# Patient Record
Sex: Female | Born: 1995 | Race: Black or African American | Hispanic: No | Marital: Single | Smoking: Never smoker
Health system: Southern US, Community
[De-identification: ages and names within clinical notes are randomized; demographics above are authoritative.]

## PROBLEM LIST (undated history)

## (undated) DIAGNOSIS — F32A Depression, unspecified: Secondary | ICD-10-CM

## (undated) DIAGNOSIS — F419 Anxiety disorder, unspecified: Secondary | ICD-10-CM

## (undated) HISTORY — PX: TONSILLECTOMY: SUR1361

## (undated) HISTORY — DX: Anxiety disorder, unspecified: F41.9

## (undated) HISTORY — DX: Depression, unspecified: F32.A

---
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
## 2016-01-09 ENCOUNTER — Emergency Department (INDEPENDENT_AMBULATORY_CARE_PROVIDER_SITE_OTHER)
Admission: EM | Admit: 2016-01-09 | Discharge: 2016-01-09 | Disposition: A | Discharge status: Home / Self Care | Payer: Medicaid Other | Source: Home / Self Care | Attending: Family Medicine | Admitting: Family Medicine

## 2016-01-09 ENCOUNTER — Encounter (HOSPITAL_COMMUNITY): Payer: Self-pay | Admitting: *Deleted

## 2016-01-09 DIAGNOSIS — J069 Acute upper respiratory infection, unspecified: Secondary | ICD-10-CM

## 2016-01-09 MED ORDER — IPRATROPIUM BROMIDE 0.06 % NA SOLN: 2.0000 | Freq: Four times a day (QID) | NASAL | Status: DC

## 2016-01-09 MED ORDER — NEOMYCIN-POLYMYXIN-HC 3.5-10000-1 OT SUSP: 4.0000 [drp] | Freq: Three times a day (TID) | OTIC | Status: DC

## 2016-01-09 NOTE — ED Provider Notes (Signed)
CSN: [id number]     Arrival date & time 01/09/16  1314 History   First MD Initiated Contact with Patient 01/09/16 1435     Chief Complaint  Patient presents with  . Otalgia   (Consider location/radiation/quality/duration/timing/severity/associated sxs/prior Treatment) Patient is a 20 y.o. female presenting with ear pain. The history is provided by the patient.  Otalgia Location:  Right Behind ear:  No abnormality Quality:  Pressure and dull Severity:  Mild Onset quality:  Gradual Duration:  2 weeks Progression:  Unchanged Chronicity:  Recurrent Relieved by:  None tried Worsened by:  Nothing tried Ineffective treatments:  None tried Associated symptoms: congestion and rhinorrhea   Associated symptoms: no cough, no ear discharge and no fever     History reviewed. No pertinent past medical history. Past Surgical History  Procedure Laterality Date  . Tonsillectomy     History reviewed. No pertinent family history. Social History  Substance Use Topics  . Smoking status: Never Smoker   . Smokeless tobacco: None  . Alcohol Use: Yes     Comment: socially   OB History    No data available     Review of Systems  Constitutional: Negative.  Negative for fever.  HENT: Positive for congestion, ear pain, postnasal drip and rhinorrhea. Negative for ear discharge.   Respiratory: Negative.  Negative for cough.   Cardiovascular: Negative.   All other systems reviewed and are negative.   Allergies  Review of patient's allergies indicates no known allergies.  Home Medications   Prior to Admission medications   Medication Sig Start Date End Date Taking? Authorizing Provider  amoxicillin-clavulanate (AUGMENTIN) 875-125 MG tablet Take 1 tablet by mouth 2 (two) times daily. One po bid x 7 days 10/21/15   Arthor Captain, PA-C  ipratropium (ATROVENT) 0.06 % nasal spray Place 2 sprays into both nostrils 4 (four) times daily. 01/09/16   Linna Hoff, MD  levonorgestrel-ethinyl estradiol  (NORDETTE) 0.15-30 MG-MCG tablet Take 1 tablet by mouth daily. 05/04/13   Brock Bad, MD  medroxyPROGESTERone (DEPO-PROVERA) 150 MG/ML injection Inject 150 mg into the muscle every 3 (three) months.    Historical Provider, MD  naproxen sodium (ANAPROX) 220 MG tablet Take 220 mg by mouth 2 (two) times daily with a meal.    Historical Provider, MD  neomycin-polymyxin-hydrocortisone (CORTISPORIN) 3.5-10000-1 otic suspension Place 4 drops into the right ear 3 (three) times daily. 01/09/16   Linna Hoff, MD  norelgestromin-ethinyl estradiol [facility]) 150-35 MCG/24HR transdermal patch Place 1 patch onto the skin once a week. 07/27/15   Brock Bad, MD  traMADol (ULTRAM) 50 MG tablet Take 1 tablet (50 mg total) by mouth every 6 (six) hours as needed. 10/21/15   Arthor Captain, PA-C   Meds Ordered and Administered this Visit  Medications - No data to display  BP 112/73 mmHg  Pulse 111  Temp(Src) 100.4 F (38 C) (Oral)  Resp 18  SpO2 97% No data found.   Physical Exam  Constitutional: She is oriented to person, place, and time. She appears well-developed and well-nourished.  HENT:  Right Ear: External ear normal.  Left Ear: External ear normal.  Nose: Mucosal edema and rhinorrhea present.  Mouth/Throat: Oropharynx is clear and moist.  Eyes: Pupils are equal, round, and reactive to light.  Neck: Normal range of motion. Neck supple.  Cardiovascular: Normal heart sounds.   Pulmonary/Chest: Effort normal and breath sounds normal.  Lymphadenopathy:    She has no cervical adenopathy.  Neurological: She is  alert and oriented to person, place, and time.  Skin: Skin is warm and dry.  Nursing note and vitals reviewed.   ED Course  Procedures (including critical care time)  Labs Review Labs Reviewed - No data to display  Imaging Review No results found.   Visual Acuity Review  Right Eye Distance:   Left Eye Distance:   Bilateral Distance:    Right Eye Near:   Left Eye Near:     Bilateral Near:         MDM   1. URI (upper respiratory infection)        Linna Hoff, MD 01/09/16 [phone]

## 2016-01-09 NOTE — ED Notes (Signed)
Pt  Reports   Both   Ears    Are  Hurting       Symptoms  X     sev  Weeks     Pt  Reports       Symptoms  Not  releived  By  otc  meds            pt  Reports  A  History  Of  Ear  Infections   In  Past

## 2016-02-27 ENCOUNTER — Ambulatory Visit (HOSPITAL_COMMUNITY)
Admission: EM | Admit: 2016-02-27 | Discharge: 2016-02-27 | Disposition: A | Discharge status: Home / Self Care | Payer: Medicaid Other | Attending: Emergency Medicine | Admitting: Emergency Medicine

## 2016-02-27 ENCOUNTER — Encounter (HOSPITAL_COMMUNITY): Payer: Self-pay | Admitting: Emergency Medicine

## 2016-02-27 DIAGNOSIS — N939 Abnormal uterine and vaginal bleeding, unspecified: Secondary | ICD-10-CM

## 2016-02-27 DIAGNOSIS — R252 Cramp and spasm: Secondary | ICD-10-CM | POA: Diagnosis not present

## 2016-02-27 DIAGNOSIS — Z9889 Other specified postprocedural states: Secondary | ICD-10-CM | POA: Diagnosis not present

## 2016-02-27 DIAGNOSIS — R8290 Unspecified abnormal findings in urine: Secondary | ICD-10-CM

## 2016-02-27 DIAGNOSIS — Z79899 Other long term (current) drug therapy: Secondary | ICD-10-CM | POA: Insufficient documentation

## 2016-02-27 DIAGNOSIS — N946 Dysmenorrhea, unspecified: Secondary | ICD-10-CM | POA: Diagnosis present

## 2016-02-27 DIAGNOSIS — N949 Unspecified condition associated with female genital organs and menstrual cycle: Secondary | ICD-10-CM | POA: Diagnosis not present

## 2016-02-27 DIAGNOSIS — R102 Pelvic and perineal pain: Secondary | ICD-10-CM

## 2016-02-27 LAB — POCT URINALYSIS DIP (DEVICE)
BILIRUBIN URINE: NEGATIVE
Glucose, UA: NEGATIVE mg/dL
KETONES UR: NEGATIVE mg/dL
Nitrite: POSITIVE — AB
Protein, ur: 30 mg/dL — AB
SPECIFIC GRAVITY, URINE: 1.015 (ref 1.005–1.030)
Urobilinogen, UA: 1 mg/dL (ref 0.0–1.0)
pH: 8.5 — ABNORMAL HIGH (ref 5.0–8.0)

## 2016-02-27 LAB — POCT I-STAT, CHEM 8
BUN: 5 mg/dL — ABNORMAL LOW (ref 6–20)
CHLORIDE: 104 mmol/L (ref 101–111)
CREATININE: 0.6 mg/dL (ref 0.44–1.00)
Calcium, Ion: 1.2 mmol/L (ref 1.12–1.23)
GLUCOSE: 82 mg/dL (ref 65–99)
HEMATOCRIT: 39 % (ref 36.0–46.0)
HEMOGLOBIN: 13.3 g/dL (ref 12.0–15.0)
POTASSIUM: 3.5 mmol/L (ref 3.5–5.1)
Sodium: 142 mmol/L (ref 135–145)
TCO2: 24 mmol/L (ref 0–100)

## 2016-02-27 LAB — POCT PREGNANCY, URINE: PREG TEST UR: NEGATIVE

## 2016-02-27 MED ORDER — NORETHINDRONE-ETH ESTRADIOL 1-35 MG-MCG PO TABS: ORAL_TABLET | ORAL | Status: DC

## 2016-02-27 MED ORDER — NAPROXEN 375 MG PO TABS: 375.0000 mg | ORAL_TABLET | Freq: Two times a day (BID) | ORAL | Status: DC

## 2016-02-27 NOTE — Discharge Instructions (Signed)

## 2016-02-27 NOTE — ED Provider Notes (Signed)
CSN: [id number]     Arrival date & time 02/27/16  1457 History   First MD Initiated Contact with Patient 02/27/16 1609     Chief Complaint  Patient presents with  . Menstrual Problem   (Consider location/radiation/quality/duration/timing/severity/associated sxs/prior Treatment) HPI Comments: 20 year old female complaining of pelvic cramps for one week. She has also had increase in menstrual flow. Her LMP began 02/21/2016. She states that the menstrual cramps are worse now than they ever have been in her flow is heavier and lasting longer than usual. She called her PCP who prescribed her birth control patches and was told that since stopping them this was not unusual. She was advised to see a local health care provider for her current problem.   History reviewed. No pertinent past medical history. Past Surgical History  Procedure Laterality Date  . Tonsillectomy     No family history on file. Social History  Substance Use Topics  . Smoking status: Never Smoker   . Smokeless tobacco: None  . Alcohol Use: Yes     Comment: socially   OB History    No data available     Review of Systems  Constitutional: Negative.   HENT: Negative.   Respiratory: Negative.   Cardiovascular: Negative for chest pain.  Gastrointestinal: Negative.   Genitourinary: Positive for vaginal bleeding, menstrual problem and pelvic pain. Negative for urgency, frequency, flank pain and vaginal discharge.       Denies any urinary symptoms.  Skin: Negative.   Neurological: Negative.   All other systems reviewed and are negative.   Allergies  Review of patient's allergies indicates no known allergies.  Home Medications   Prior to Admission medications   Medication Sig Start Date End Date Taking? Authorizing Provider  amoxicillin-clavulanate (AUGMENTIN) 875-125 MG tablet Take 1 tablet by mouth 2 (two) times daily. One po bid x 7 days 10/21/15   Arthor CaptainAbigail Tewell, PA-C  ipratropium (ATROVENT) 0.06 % nasal spray  Place 2 sprays into both nostrils 4 (four) times daily. 01/09/16   Linna HoffJames D Kindl, MD  levonorgestrel-ethinyl estradiol (NORDETTE) 0.15-30 MG-MCG tablet Take 1 tablet by mouth daily. 05/04/13   Brock Badharles A Harper, MD  medroxyPROGESTERone (DEPO-PROVERA) 150 MG/ML injection Inject 150 mg into the muscle every 3 (three) months.    Historical Provider, MD  naproxen (NAPROSYN) 375 MG tablet Take 1 tablet (375 mg total) by mouth 2 (two) times daily. Prn pelvic cramps 02/27/16   Hayden Rasmussenavid Burnie Therien, NP  naproxen sodium (ANAPROX) 220 MG tablet Take 220 mg by mouth 2 (two) times daily with a meal.    Historical Provider, MD  neomycin-polymyxin-hydrocortisone (CORTISPORIN) 3.5-10000-1 otic suspension Place 4 drops into the right ear 3 (three) times daily. 01/09/16   Linna HoffJames D Kindl, MD  norelgestromin-ethinyl estradiol [employer](XULANE) 150-35 MCG/24HR transdermal patch Place 1 patch onto the skin once a week. 07/27/15   Brock Badharles A Harper, MD  norethindrone-ethinyl estradiol 1/35 (ORTHO-NOVUM 1/35, 28,) tablet One tab bid x 3 d, then 1 q d. 02/27/16   Hayden Rasmussenavid Mariyanna Mucha, NP  traMADol (ULTRAM) 50 MG tablet Take 1 tablet (50 mg total) by mouth every 6 (six) hours as needed. 10/21/15   Arthor CaptainAbigail Coury, PA-C   Meds Ordered and Administered this Visit  Medications - No data to display  BP 91/51 mmHg  Pulse 85  Temp(Src) 98.1 F (36.7 C) (Oral)  Resp 12  SpO2 100%  LMP 02/27/2016 No data found.   Physical Exam  Constitutional: She is oriented to person, place, and time. She  appears well-developed and well-nourished. No distress.  Neck: Normal range of motion. Neck supple.  Cardiovascular: Normal rate, regular rhythm and normal heart sounds.   Pulmonary/Chest: Effort normal. No respiratory distress.  Abdominal: Soft. Bowel sounds are normal. She exhibits no distension. There is no tenderness. There is no rebound and no guarding.  Mild tenderness over the lower mid and right pelvis.  Neurological: She is alert and oriented to person, place, and  time. She exhibits normal muscle tone.  Skin: Skin is warm and dry.  Nursing note and vitals reviewed.   ED Course  Procedures (including critical care time)  Labs Review Labs Reviewed  POCT URINALYSIS DIP (DEVICE) - Abnormal; Notable for the following:    Hgb urine dipstick MODERATE (*)    pH 8.5 (*)    Protein, ur 30 (*)    Nitrite POSITIVE (*)    Leukocytes, UA TRACE (*)    All other components within normal limits  POCT I-STAT, CHEM 8 - Abnormal; Notable for the following:    BUN 5 (*)    All other components within normal limits  URINE CULTURE  POCT PREGNANCY, URINE   Results for orders placed or performed during the hospital encounter of 02/27/16  POCT urinalysis dip (device)  Result Value Ref Range   Glucose, UA NEGATIVE NEGATIVE mg/dL   Bilirubin Urine NEGATIVE NEGATIVE   Ketones, ur NEGATIVE NEGATIVE mg/dL   Specific Gravity, Urine 1.015 1.005 - 1.030   Hgb urine dipstick MODERATE (A) NEGATIVE   pH 8.5 (H) 5.0 - 8.0   Protein, ur 30 (A) NEGATIVE mg/dL   Urobilinogen, UA 1.0 0.0 - 1.0 mg/dL   Nitrite POSITIVE (A) NEGATIVE   Leukocytes, UA TRACE (A) NEGATIVE  Pregnancy, urine POC  Result Value Ref Range   Preg Test, Ur NEGATIVE NEGATIVE  I-STAT, chem 8  Result Value Ref Range   Sodium 142 135 - 145 mmol/L   Potassium 3.5 3.5 - 5.1 mmol/L   Chloride 104 101 - 111 mmol/L   BUN 5 (L) 6 - 20 mg/dL   Creatinine, Ser 1.61 0.44 - 1.00 mg/dL   Glucose, Bld 82 65 - 99 mg/dL   Calcium, Ion 0.96 1.12 - 1.23 mmol/L   TCO2 24 0 - 100 mmol/L   Hemoglobin 13.3 12.0 - 15.0 g/dL   HCT 04.5 40.9 - 81.1 %     Imaging Review No results found.   Visual Acuity Review  Right Eye Distance:   Left Eye Distance:   Bilateral Distance:    Right Eye Near:   Left Eye Near:    Bilateral Near:         MDM   1. Abnormal uterine bleeding (AUB)   2. Pelvic cramping   3. Abnormal finding on urinalysis    Urine culture pending Meds ordered this encounter  Medications   . norethindrone-ethinyl estradiol 1/35 (ORTHO-NOVUM 1/35, 28,) tablet    Sig: One tab bid x 3 d, then 1 q d.    Dispense:  1 Package    Refill:  0    Order Specific Question:  Supervising Provider    Answer:  Charm Rings [id number]  . naproxen (NAPROSYN) 375 MG tablet    Sig: Take 1 tablet (375 mg total) by mouth 2 (two) times daily. Prn pelvic cramps    Dispense:  20 tablet    Refill:  0    Order Specific Question:  Supervising Provider    Answer:  Charm Rings [phone]  Hayden Rasmussen, NP 02/27/16 [phone]

## 2016-02-27 NOTE — ED Notes (Signed)
Here for irregular menses after stopping BC patches 2 mnths ago Really bad cramps radiating with back pain and clots Denies nausea, vomiting LMP- started today

## 2016-03-01 ENCOUNTER — Telehealth: Payer: Self-pay | Admitting: Internal Medicine

## 2016-03-01 DIAGNOSIS — N390 Urinary tract infection, site not specified: Secondary | ICD-10-CM

## 2016-03-01 MED ORDER — CEPHALEXIN 500 MG PO CAPS: 500.0000 mg | ORAL_CAPSULE | Freq: Two times a day (BID) | ORAL | Status: AC

## 2016-03-01 NOTE — ED Notes (Signed)
Please let patient know that urine cx grew E coli sensitive to cephalexin; rx cephalexin sent to pharmacy of record ([facility] [address]). Recheck for persistent symptoms.  LM  Eustace MooreLaura W Laqueshia Cihlar, MD 03/01/16 Windell [id number]

## 2016-03-03 LAB — URINE CULTURE: Special Requests: NORMAL

## 2016-03-05 ENCOUNTER — Telehealth (HOSPITAL_COMMUNITY): Payer: Self-pay | Admitting: Emergency Medicine

## 2016-03-05 NOTE — ED Notes (Signed)
Called pt and notified of recent lab results from visit 02/27/16 Pt ID'd properly... Reports feeling somewhat better.... Has not picked up Antibiotic Adv pt to p/u Rx and finish tx.   Per Dr. Dayton ScrapeMurray,  Clinical staff, please let patient know that urine cx grew E coli sensitive to cephalexin; rx cephalexin sent to pharmacy of record ([facility] at [address] and [address]). Recheck for persistent symptoms. LM  Adv pt if sx are not getting better to return  Pt verb understanding

## 2016-03-06 ENCOUNTER — Ambulatory Visit: Payer: Self-pay | Admitting: Certified Nurse Midwife

## 2016-05-04 ENCOUNTER — Encounter (HOSPITAL_COMMUNITY): Payer: Self-pay

## 2016-05-04 ENCOUNTER — Emergency Department (HOSPITAL_COMMUNITY)
Admission: EM | Admit: 2016-05-04 | Discharge: 2016-05-04 | Disposition: A | Discharge status: Home / Self Care | Payer: Medicaid Other | Attending: Emergency Medicine | Admitting: Emergency Medicine

## 2016-05-04 DIAGNOSIS — R42 Dizziness and giddiness: Secondary | ICD-10-CM | POA: Diagnosis present

## 2016-05-04 DIAGNOSIS — Z79899 Other long term (current) drug therapy: Secondary | ICD-10-CM | POA: Insufficient documentation

## 2016-05-04 DIAGNOSIS — R55 Syncope and collapse: Secondary | ICD-10-CM

## 2016-05-04 LAB — CBG MONITORING, ED: Glucose-Capillary: 77 mg/dL (ref 65–99)

## 2016-05-04 LAB — URINALYSIS, ROUTINE W REFLEX MICROSCOPIC
Bilirubin Urine: NEGATIVE
Glucose, UA: NEGATIVE mg/dL
Hgb urine dipstick: NEGATIVE
Ketones, ur: NEGATIVE mg/dL
Leukocytes, UA: NEGATIVE
Nitrite: NEGATIVE
Protein, ur: NEGATIVE mg/dL
Specific Gravity, Urine: 1.025 (ref 1.005–1.030)
pH: 6 (ref 5.0–8.0)

## 2016-05-04 LAB — BASIC METABOLIC PANEL
Anion gap: 5 (ref 5–15)
BUN: 6 mg/dL (ref 6–20)
CO2: 25 mmol/L (ref 22–32)
Calcium: 9.4 mg/dL (ref 8.9–10.3)
Chloride: 107 mmol/L (ref 101–111)
Creatinine, Ser: 0.73 mg/dL (ref 0.44–1.00)
GFR calc Af Amer: >60 mL/min (ref >60)
GFR calc non Af Amer: >60 mL/min (ref >60)
Glucose, Bld: 80 mg/dL (ref 65–99)
Potassium: 3.4 mmol/L — ABNORMAL LOW (ref 3.5–5.1)
Sodium: 137 mmol/L (ref 135–145)

## 2016-05-04 LAB — CBC
HCT: 32.4 % — ABNORMAL LOW (ref 36.0–46.0)
Hemoglobin: 10.6 g/dL — ABNORMAL LOW (ref 12.0–15.0)
MCH: 26.6 pg (ref 26.0–34.0)
MCHC: 32.7 g/dL (ref 30.0–36.0)
MCV: 81.2 fL (ref 78.0–100.0)
Platelets: 269 10*3/uL (ref 150–400)
RBC: 3.99 MIL/uL (ref 3.87–5.11)
RDW: 14 % (ref 11.5–15.5)
WBC: 6.4 10*3/uL (ref 4.0–10.5)

## 2016-05-04 LAB — PREGNANCY, URINE: PREG TEST UR: NEGATIVE

## 2016-05-04 NOTE — Discharge Instructions (Signed)
Push fluids: take small frequent sips of water or Gatorade, do not drink any soda, juice or caffeinated beverages.    Do not hesitate to return to the emergency room for any new, worsening or concerning symptoms.  Please obtain primary care using resource guide below. Let them know that you were seen in the emergency room and that they will need to obtain records for further outpatient management.   [employer] [facility] [employer] is a great source of information about community services available.  Access by dialing 2-1-1 from anywhere in [place], or by website -  PooledIncome.pl.   Other Local Resources (Updated 11/2015)  Dental  Care   Services    Phone Number and Address  Cost  [facility] For children 57 - 16 years of age:   Cleaning  Tooth brushing/flossing instruction  Sealants, fillings, crowns  Extractions  Emergency treatment  [phone] [address] [place], [place] [address] Charges based on family income.  Medicaid and some insurance plans accepted.     [facility] - [facility], fillings, crowns  Extractions  Emergency treatment [phone] [address] [place], [place]  Pregnant women 35 years of age or older with a Medicaid card  [facility] [place]  Cleaning  Sealants, fillings, crowns  Extractions  Emergency treatment [phone] [address] [place], [place] Pregnant women 34 years of age or older with a Medicaid card  [facility] Department of Health - [facility] For children 3 - 30 years of age:   Cleaning  Tooth brushing/flossing instruction  Sealants, fillings, crowns  Extractions  Emergency treatment Limited orthodontic services for patients with Medicaid [phone] [address] [place], [place] [address] Medicaid and [facility] Health Choice cover  for children up to age 80 and pregnant women.  Parents of children up to age 60 without Medicaid pay a reduced fee at time of service.  [facility] Department of [employer] For children 77 - 90 years of age:   Cleaning  Tooth brushing/flossing instruction  Sealants, fillings, crowns  Extractions  Emergency treatment Limited orthodontic services for patients with Medicaid [phone] [address] [place], [place].  Medicaid and [place] Health Choice cover for children up to age 67 and pregnant women.  Parents of children up to age 28 without Medicaid pay a reduced fee.  [facility]  Sealants, fillings, crowns  Extractions  Hours: Tuesdays and Thursdays, 4:15 - 8 pm [phone] [address] [place], [place] [address] Services free of charge to [facility] residents ages 18-64 who do not have health insurance, Medicare, [place], or [place] benefits and fall within federal poverty guidelines  [employer]    Provides dental care in addition to primary medical care, nutritional counseling, and pharmacy:  Nurse, mental health, fillings, crowns  Extractions                  [phone] [facility], [address] [place], [place]  [phone] [facility], [address] [place], [place]  [phone] [facility] [place], [place]  [phone] [facility], [address] [place], [place]  [phone] [employer] [address] [place], [place] Accepts [place], [place], most insurance.  Also provides services available to all with  fees adjusted based on ability to pay.    [facility]  Cleaning  Tooth brushing/flossing instruction  Sealants, fillings, crowns  Extractions  Emergency treatment Hours: Tuesdays,  Thursdays, and Fridays from 8 am to 5 pm by appointment only. [phone] [address] [place], [place] [phone] [facility] residents with Medicaid (depending on eligibility) and children with [facility] Health Choice - call for more information.  [facility]  Extractions only  Hours: 2nd and 4th Thursday of each month from 6:30 am - 9 am.   [phone]. [phone] [place], [place] [address] Ages 6618 and older only.  Patients are seen on a first come, first served basis.  [employer]  [employer]  Fillings  Extractions  Orthodontics  Endodontics  Implants/Crowns/Bridges  Complete and partial dentures [phone] [place], [place] Patients must complete an application for services.  There is often a waiting list.

## 2016-05-04 NOTE — ED Notes (Signed)
Per PT, Pt reports at this beginning of this week starting to have increased dizziness, lightheadedness, and chills. Pt denies being on her period or having increased bleeding. Denies pain, nausea, vomiting, or diarrhea. Alert and Oriented x4 upon arrival.

## 2016-05-04 NOTE — ED Notes (Signed)
Brought patient back to room; patient undressed, in gown, on monitor, continuous pulse oximetry and blood pressure cuff 

## 2016-05-04 NOTE — ED Notes (Signed)
POCT CBG resulted 77; Brooke, RN aware

## 2016-05-04 NOTE — ED Provider Notes (Signed)
CSN: [id number]     Arrival date & time 05/04/16  1121 History   First MD Initiated Contact with Patient 05/04/16 1141     Chief Complaint  Patient presents with  . Dizziness     (Consider location/radiation/quality/duration/timing/severity/associated sxs/prior Treatment) HPI   Blood pressure 103/65, pulse 78, temperature 98.1 F (36.7 C), temperature source Oral, resp. rate 18, height  (1.575 m), weight 51.256 kg, last menstrual period 04/16/2016, SpO2 100 %.  Nuha Degner is a 20 y.o. female complaining of lightheaded sensation over the course of the last week, she also reports chills and decreased by mouth intake, no nausea, vomiting, diarrhea, chest pain, palpitations, abdominal pain, calf pain, leg swelling, history of DVT/PE. Has no history of blood transfusions but thinks she may have had a prescription for iron supplements in the past.   History reviewed. No pertinent past medical history. Past Surgical History  Procedure Laterality Date  . Tonsillectomy     No family history on file. Social History  Substance Use Topics  . Smoking status: Never Smoker   . Smokeless tobacco: None  . Alcohol Use: Yes     Comment: socially   OB History    No data available     Review of Systems  10 systems reviewed and found to be negative, except as noted in the HPI.  Allergies  Review of patient's allergies indicates no known allergies.  Home Medications   Prior to Admission medications   Medication Sig Start Date End Date Taking? Authorizing Provider  amoxicillin-clavulanate (AUGMENTIN) 875-125 MG tablet Take 1 tablet by mouth 2 (two) times daily. One po bid x 7 days 10/21/15   Arthor Captain, PA-C  ipratropium (ATROVENT) 0.06 % nasal spray Place 2 sprays into both nostrils 4 (four) times daily. 01/09/16   Linna Hoff, MD  levonorgestrel-ethinyl estradiol (NORDETTE) 0.15-30 MG-MCG tablet Take 1 tablet by mouth daily. 05/04/13   Brock Bad, MD  medroxyPROGESTERone  (DEPO-PROVERA) 150 MG/ML injection Inject 150 mg into the muscle every 3 (three) months.    Historical Provider, MD  naproxen (NAPROSYN) 375 MG tablet Take 1 tablet (375 mg total) by mouth 2 (two) times daily. Prn pelvic cramps 02/27/16   Hayden Rasmussen, NP  naproxen sodium (ANAPROX) 220 MG tablet Take 220 mg by mouth 2 (two) times daily with a meal.    Historical Provider, MD  neomycin-polymyxin-hydrocortisone (CORTISPORIN) 3.5-10000-1 otic suspension Place 4 drops into the right ear 3 (three) times daily. 01/09/16   Linna Hoff, MD  norelgestromin-ethinyl estradiol Burr [employer]ico) 150-35 MCG/24HR transdermal patch Place 1 patch onto the skin once a week. 07/27/15   Brock Bad, MD  norethindrone-ethinyl estradiol 1/35 (ORTHO-NOVUM 1/35, 28,) tablet One tab bid x 3 d, then 1 q d. 02/27/16   Hayden Rasmussen, NP  traMADol (ULTRAM) 50 MG tablet Take 1 tablet (50 mg total) by mouth every 6 (six) hours as needed. 10/21/15   Abigail Wanamaker, PA-C   BP 103/65 mmHg  Pulse 78  Temp(Src) 98.1 F (36.7 C) (Oral)  Resp 18  Ht  (1.575 m)  Wt 51.256 kg  BMI 20.66 kg/m2  SpO2 100%  LMP 04/16/2016 (Approximate) Physical Exam  Constitutional: She is oriented to person, place, and time. She appears well-developed and well-nourished. No distress.  HENT:  Head: Normocephalic.  Mouth/Throat: Oropharynx is clear and moist.  No conjunctival pallor  Eyes: Conjunctivae are normal.  Neck: Normal range of motion. No JVD present. No tracheal deviation present.  Cardiovascular: Normal rate, regular rhythm and intact distal pulses.   Radial pulse equal bilaterally  Pulmonary/Chest: Effort normal and breath sounds normal. No stridor. No respiratory distress. She has no wheezes. She has no rales. She exhibits no tenderness.  Abdominal: Soft. She exhibits no distension and no mass. There is no tenderness. There is no rebound and no guarding.  Musculoskeletal: Normal range of motion. She exhibits no edema or tenderness.  No calf  asymmetry, superficial collaterals, palpable cords, edema, Homans sign negative bilaterally.    Neurological: She is alert and oriented to person, place, and time.  Skin: Skin is warm. She is not diaphoretic.  Psychiatric: She has a normal mood and affect.  Nursing note and vitals reviewed.   ED Course  Procedures (including critical care time) Labs Review Labs Reviewed  CBC - Abnormal; Notable for the following:    Hemoglobin 10.6 (*)    HCT 32.4 (*)    All other components within normal limits  BASIC METABOLIC PANEL  URINALYSIS, ROUTINE W REFLEX MICROSCOPIC (NOT AT [facility])  CBG MONITORING, ED    Imaging Review No results found. I have personally reviewed and evaluated these images and lab results as part of my medical decision-making.   EKG Interpretation None      MDM   Final diagnoses:  Pre-syncope    Filed Vitals:   05/04/16 1126 05/04/16 1149  BP: 108/62 103/65  Pulse: 80 78  Temp: 98.1 F (36.7 C)   TempSrc: Oral   Resp: 16 18  Height:  5\' 2"  (1.575 m)  Weight:  51.256 kg  SpO2: 100% 100%     Orpah CobbCania Dogan is 20 y.o. female presenting with Near syncope, states she's been eating and drinking less than normal. No chest pain, palpitations, calf pain or leg swelling consistent with DVT/PE. Patient afebrile with stable vital signs, overall very well appearing, no heavy menses or prior transfusions. EKG with no arrhythmia.  Work is reassuring with no significant anemia or electrolyte abnormality, urinalysis is not consistent with infection she has a normal blood glucose and is not pregnant. I think this is likely secondary to a mild dehydration. Encourage patient to push fluids.  Evaluation does not show pathology that would require ongoing emergent intervention or inpatient treatment. Pt is hemodynamically stable and mentating appropriately. Discussed findings and plan with patient/guardian, who agrees with care plan. All questions answered. Return precautions  discussed and outpatient follow up given.         Wynetta Emeryicole Kingstyn Deruiter, PA-C 05/04/16 1254  Raeford RazorStephen Kohut, MD 05/11/16 2207

## 2016-06-29 ENCOUNTER — Encounter (HOSPITAL_COMMUNITY): Payer: Self-pay

## 2016-06-29 ENCOUNTER — Ambulatory Visit (HOSPITAL_COMMUNITY)
Admission: EM | Admit: 2016-06-29 | Discharge: 2016-06-29 | Disposition: A | Discharge status: Home / Self Care | Payer: Medicaid Other | Attending: Family Medicine | Admitting: Family Medicine

## 2016-06-29 DIAGNOSIS — R300 Dysuria: Secondary | ICD-10-CM

## 2016-06-29 LAB — POCT URINALYSIS DIP (DEVICE)
BILIRUBIN URINE: NEGATIVE
Glucose, UA: NEGATIVE mg/dL
Hgb urine dipstick: NEGATIVE
Ketones, ur: NEGATIVE mg/dL
LEUKOCYTES UA: NEGATIVE
NITRITE: NEGATIVE
PH: 7 (ref 5.0–8.0)
Protein, ur: NEGATIVE mg/dL
Specific Gravity, Urine: 1.02 (ref 1.005–1.030)
UROBILINOGEN UA: 0.2 mg/dL (ref 0.0–1.0)

## 2016-06-29 NOTE — ED Provider Notes (Signed)
CSN: [id number]     Arrival date & time 06/29/16  1455 History   First MD Initiated Contact with Patient 06/29/16 1512     Chief Complaint  Patient presents with  . Urinary Tract Infection   (Consider location/radiation/quality/duration/timing/severity/associated sxs/prior Treatment) HPI Patient reports that she has had burning with urinary over the last couple of days.  She reports an odor associated with her dark urine.  She notes that symptoms feel similar to the last UTI she had.  She denies sexual activity for last few months.  She reports Patient's last menstrual period was 06/15/2016 (within days).  No back pain, nausea, vomiting, abdominal pain, vaginal discharge, fevers, chills.  She has not used anything for urinary symptoms yet.   History reviewed. No pertinent past medical history. Past Surgical History:  Procedure Laterality Date  . TONSILLECTOMY     History reviewed. No pertinent family history. Social History  Substance Use Topics  . Smoking status: Never Smoker  . Smokeless tobacco: Never Used  . Alcohol use No   OB History    No data available     Review of Systems  Constitutional: Negative for chills and fever.  Gastrointestinal: Negative for abdominal pain, nausea and vomiting.  Genitourinary: Positive for dysuria (with dark urine that has an odor to it), frequency and urgency. Negative for hematuria, pelvic pain, vaginal discharge and vaginal pain.    Allergies  Review of patient's allergies indicates no known allergies.  Home Medications   Prior to Admission medications   Medication Sig Start Date End Date Taking? Authorizing Provider  amoxicillin-clavulanate (AUGMENTIN) 875-125 MG tablet Take 1 tablet by mouth 2 (two) times daily. One po bid x 7 days 10/21/15   Arthor Captain, PA-C  ipratropium (ATROVENT) 0.06 % nasal spray Place 2 sprays into both nostrils 4 (four) times daily. 01/09/16   Linna Hoff, MD  levonorgestrel-ethinyl estradiol (NORDETTE)  0.15-30 MG-MCG tablet Take 1 tablet by mouth daily. 05/04/13   Brock Bad, MD  medroxyPROGESTERone (DEPO-PROVERA) 150 MG/ML injection Inject 150 mg into the muscle every 3 (three) months.    Historical Provider, MD  naproxen (NAPROSYN) 375 MG tablet Take 1 tablet (375 mg total) by mouth 2 (two) times daily. Prn pelvic cramps 02/27/16   Hayden Rasmussen, NP  naproxen sodium (ANAPROX) 220 MG tablet Take 220 mg by mouth 2 (two) times daily with a meal.    Historical Provider, MD  neomycin-polymyxin-hydrocortisone (CORTISPORIN) 3.5-10000-1 otic suspension Place 4 drops into the right ear 3 (three) times daily. 01/09/16   Linna Hoff, MD  norelgestromin-ethinyl estradiol [employer]ico) 150-35 MCG/24HR transdermal patch Place 1 patch onto the skin once a week. 07/27/15   Brock Bad, MD  norethindrone-ethinyl estradiol 1/35 (ORTHO-NOVUM 1/35, 28,) tablet One tab bid x 3 d, then 1 q d. 02/27/16   Hayden Rasmussen, NP  traMADol (ULTRAM) 50 MG tablet Take 1 tablet (50 mg total) by mouth every 6 (six) hours as needed. 10/21/15   Arthor Captain, PA-C   Meds Ordered and Administered this Visit  Medications - No data to display  BP 102/68 (BP Location: Left Arm)   Pulse 75   Temp 98.5 F (36.9 C) (Oral)   Resp 16   LMP 06/15/2016 (Within Days)   SpO2 96%  No data found.  Physical Exam  Cardiovascular: Normal rate.   Pulmonary/Chest: Effort normal.  Abdominal: Soft. Bowel sounds are normal. She exhibits no distension and no mass. There is no rebound and no guarding.  Genitourinary:  Genitourinary Comments: +suprapubic TTP  Musculoskeletal:  No CVA TTP    Urgent Care Course   Clinical Course   1522: UA ordered  Procedures (including critical care time)  Labs Review Labs Reviewed  POCT URINALYSIS DIP (DEVICE)    Imaging Review No results found.   MDM   1. Dysuria     Orpah CobbCania Wallner is a 20 y.o. female that presents for dysuria. UA negative for evidence of infection.  Patient afebrile and  demonstrating no systemic symptoms of illness.  Reports no sexual activity for several months.  Declines STI testing today.  Symptoms could be from urethral irritation.  Discussed vaginal hygiene, oral hydration with patient.  If patient has persistent symptoms, could consider STI evaluation vs referral to urology for evaluation for interstitial cystitis.  Return precautions reviewed.  All questions answered.  Patient to follow up prn.  This patient was discussed with Homero FellersFrank, NP who agrees with my assessment and plan.   Ashly M. Nadine CountsGottschalk, DO PGY-3, [facility]        Raliegh IpAshly M Gottschalk, DO 06/29/16 1545

## 2016-06-29 NOTE — ED Triage Notes (Signed)
[facility] of burning, odor and dark yellow urine pt thinks she may have a possible UTI. No acute distress

## 2016-06-29 NOTE — Discharge Instructions (Signed)
Your urinalysis did not reveal any evidence of UTI.  I suspect that you are having urethral irritation.  I encourage you to drink plenty of water.  You can take over the counter Cranberry tablets to promote bladder health.  Wear cotton underwear and avoid applying harsh soaps and lotions to the genital area.  Healthy vaginal hygiene practices   -  Avoid sleeper pajamas. Nightgowns allow air to circulate.  Sleep without underpants whenever possible.  -  Wear cotton underpants during the day. Double-rinse underwear after washing to avoid residual irritants. Do not use fabric softeners for underwear and swimsuits.  - Avoid tights, leotards, leggings, "skinny" jeans, and other tight-fitting clothing. Skirts and loose-fitting pants allow air to circulate.  - Avoid pantyliners.  Instead use tampons or cotton pads.  - Daily warm bathing is helpful:     - Soak in clean water (no soap) for 10 to 15 minutes. Adding vinegar or baking soda to the water has not been specifically studied and may not be better than clean water alone.      - Use soap to wash regions other than the genital area just before getting out of the tub. Limit use of any soap on genital areas. Use fragance-free soaps.     - Rinse the genital area well and gently pat dry.  Don't rub.  Hair dryer to assist with drying can be used only if on cool setting.     - Do not use bubble baths or perfumed soaps.  - Do not use any feminine sprays, douches or powders.  These contain chemicals that will irritate the skin.  - If the genital area is tender or swollen, cool compresses may relieve the discomfort. Unscented wet wipes can be used instead of toilet paper for wiping.   - Emollients, such as Vaseline, may help protect skin and can be applied to the irritated area.  - Always remember to wipe front-to-back after bowel movements. Pat dry after urination.  - Do not sit in wet swimsuits for long periods of time after swimming

## 2016-07-09 ENCOUNTER — Emergency Department (HOSPITAL_COMMUNITY)
Admission: EM | Admit: 2016-07-09 | Discharge: 2016-07-09 | Disposition: A | Discharge status: Home / Self Care | Payer: Medicaid Other | Attending: Emergency Medicine | Admitting: Emergency Medicine

## 2016-07-09 ENCOUNTER — Encounter (HOSPITAL_COMMUNITY): Payer: Self-pay | Admitting: *Deleted

## 2016-07-09 DIAGNOSIS — Z91040 Latex allergy status: Secondary | ICD-10-CM | POA: Diagnosis not present

## 2016-07-09 DIAGNOSIS — N898 Other specified noninflammatory disorders of vagina: Secondary | ICD-10-CM | POA: Diagnosis not present

## 2016-07-09 DIAGNOSIS — R55 Syncope and collapse: Secondary | ICD-10-CM | POA: Diagnosis not present

## 2016-07-09 DIAGNOSIS — N926 Irregular menstruation, unspecified: Secondary | ICD-10-CM | POA: Insufficient documentation

## 2016-07-09 LAB — BASIC METABOLIC PANEL
ANION GAP: 5 (ref 5–15)
BUN: 8 mg/dL (ref 6–20)
CALCIUM: 9 mg/dL (ref 8.9–10.3)
CO2: 24 mmol/L (ref 22–32)
CREATININE: 0.66 mg/dL (ref 0.44–1.00)
Chloride: 109 mmol/L (ref 101–111)
GFR calc Af Amer: >60 mL/min (ref >60)
GLUCOSE: 93 mg/dL (ref 65–99)
POTASSIUM: 3.5 mmol/L (ref 3.5–5.1)
Sodium: 138 mmol/L (ref 135–145)

## 2016-07-09 LAB — CBC
HCT: 32.1 % — ABNORMAL LOW (ref 36.0–46.0)
HEMOGLOBIN: 10.2 g/dL — AB (ref 12.0–15.0)
MCH: 26.6 pg (ref 26.0–34.0)
MCHC: 31.8 g/dL (ref 30.0–36.0)
MCV: 83.8 fL (ref 78.0–100.0)
PLATELETS: 248 10*3/uL (ref 150–400)
RBC: 3.83 MIL/uL — ABNORMAL LOW (ref 3.87–5.11)
RDW: 14.2 % (ref 11.5–15.5)
WBC: 8.4 10*3/uL (ref 4.0–10.5)

## 2016-07-09 LAB — I-STAT BETA HCG BLOOD, ED (MC, WL, AP ONLY)

## 2016-07-09 LAB — URINE MICROSCOPIC-ADD ON: WBC, UA: NONE SEEN WBC/hpf (ref 0–5)

## 2016-07-09 LAB — CBG MONITORING, ED: GLUCOSE-CAPILLARY: 94 mg/dL (ref 65–99)

## 2016-07-09 LAB — URINALYSIS, ROUTINE W REFLEX MICROSCOPIC
BILIRUBIN URINE: NEGATIVE
Glucose, UA: NEGATIVE mg/dL
KETONES UR: NEGATIVE mg/dL
Leukocytes, UA: NEGATIVE
NITRITE: NEGATIVE
PROTEIN: NEGATIVE mg/dL
Specific Gravity, Urine: 1.011 (ref 1.005–1.030)
pH: 7 (ref 5.0–8.0)

## 2016-07-09 MED ORDER — DIPHENHYDRAMINE HCL 50 MG/ML IJ SOLN
50.0000 mg | Freq: Once | INTRAMUSCULAR | Status: AC
  Administered 2016-07-09: 50 mg via INTRAVENOUS
  Filled 2016-07-09: qty 1

## 2016-07-09 MED ORDER — KETOROLAC TROMETHAMINE 30 MG/ML IJ SOLN
30.0000 mg | Freq: Once | INTRAMUSCULAR | Status: AC
  Administered 2016-07-09: 30 mg via INTRAVENOUS
  Filled 2016-07-09: qty 1

## 2016-07-09 MED ORDER — SODIUM CHLORIDE 0.9 % IV BOLUS (SEPSIS)
1000.0000 mL | Freq: Once | INTRAVENOUS | Status: AC
  Administered 2016-07-09: 1000 mL via INTRAVENOUS

## 2016-07-09 MED ORDER — METOCLOPRAMIDE HCL 5 MG/ML IJ SOLN
10.0000 mg | Freq: Once | INTRAMUSCULAR | Status: AC
  Administered 2016-07-09: 10 mg via INTRAVENOUS
  Filled 2016-07-09: qty 2

## 2016-07-09 NOTE — Discharge Instructions (Signed)

## 2016-07-09 NOTE — ED Triage Notes (Signed)
Pt reports feeling lightheaded and dizzy recently. Was seen at [facility] on 8/11 for same and was told to take vitamins due to anemia. Reports recent menstrual was longer than normal. No acute distress noted at triage.

## 2016-07-09 NOTE — ED Notes (Signed)
Pt states she had a black out this morning.  Went to get up and felt light headed.  Did fall.  Denies head injury.  Denies pain anywhere at this time except her head, which pt sts she has had chronic headaches x 1 week.  Pt sts no interventions help to diminish pain at home.

## 2016-07-09 NOTE — ED Provider Notes (Signed)
Emergency Department Provider Note   I have reviewed the triage vital signs and the nursing notes.   HISTORY  Chief Complaint Loss of Consciousness   HPI Melanie Andrews is a 20 y.o. female with no significant PMH presents to the emergency department for evaluation of syncopal episode this morning. Patient states that she sat up in bed first thing this morning and was feeling a little bit lightheaded. When she went to stand she felt very lightheaded and then fell to the floor. Her roommate witnessed the event and denies any seizure activity. The patient immediately regained consciousness with no confusion. She does note an intermittent headache over the last week. She is currently having her menstrual period states is lasting longer than normal. Her headache is bitemporal and throbbing. She notes it is worse in the evenings and responds to over-the-counter ibuprofen. She denies history of migraines or similar headaches. Denies any sudden onset maximal intensity headaches. No vision changes. No fevers, neck pain, stiffness. The patient states that she is staying well-hydrated but has not been eating much recently. Prior to syncope the patient had no chest pain or heart palpitations.    History reviewed. No pertinent past medical history.  There are no active problems to display for this patient.   Past Surgical History:  Procedure Laterality Date  . TONSILLECTOMY      Current Outpatient Rx  . Order #: [id number] Class: Historical Med  . Order #: [id number] Class: Historical Med  . Order #: [id number] Class: Normal    Allergies Latex  History reviewed. No pertinent family history.  Social History Social History  Substance Use Topics  . Smoking status: Never Smoker  . Smokeless tobacco: Never Used  . Alcohol use No    Review of Systems  Constitutional: No fever/chills; positive lightheadedness and syncope.  Eyes: No visual changes. ENT: No sore throat. Cardiovascular: Denies  chest pain. Respiratory: Denies shortness of breath. Gastrointestinal: No abdominal pain.  No nausea, no vomiting.  No diarrhea.  No constipation. Genitourinary: Negative for dysuria. Musculoskeletal: Negative for back pain. Skin: Negative for rash. Neurological: Negative for focal weakness or numbness. Positive HA.   10-point ROS otherwise negative.  ____________________________________________   PHYSICAL EXAM:  VITAL SIGNS: ED Triage Vitals  Enc Vitals Group     BP 07/09/16 1325 104/68     Pulse Rate 07/09/16 1325 78     Resp 07/09/16 1324 18     Temp 07/09/16 1325 98.8 F (37.1 C)     Temp Source 07/09/16 1324 Oral     SpO2 07/09/16 1325 100 %     Weight 07/09/16 1326 110 lb (49.9 kg)     Height 07/09/16 1326 5\' 2"  (1.575 m)     Pain Score 07/09/16 1544 7    Constitutional: Alert and oriented. Well appearing and in no acute distress. Eyes: Conjunctivae are normal. PERRL. EOMI. Head: Atraumatic. Nose: No congestion/rhinnorhea. Mouth/Throat: Mucous membranes are dry. Oropharynx non-erythematous. Neck: No stridor.   Cardiovascular: Normal rate, regular rhythm. Good peripheral circulation. Grossly normal heart sounds.   Respiratory: Normal respiratory effort.  No retractions. Lungs CTAB. Gastrointestinal: Soft and nontender. No distention.  Musculoskeletal: No lower extremity tenderness nor edema. No gross deformities of extremities. Neurologic:  Normal speech and language. No gross focal neurologic deficits are appreciated. Normal CN exam. No limb ataxia. Normal finger-to-nose testing.  Skin:  Skin is warm, dry and intact. No rash noted. Psychiatric: Mood and affect are normal. Speech and behavior are normal.  ____________________________________________  LABS (all labs ordered are listed, but only abnormal results are displayed)  Labs Reviewed  CBC - Abnormal; Notable for the following:       Result Value   RBC 3.83 (*)    Hemoglobin 10.2 (*)    HCT 32.1 (*)      All other components within normal limits  URINALYSIS, ROUTINE W REFLEX MICROSCOPIC (NOT AT [facility]) - Abnormal; Notable for the following:    Hgb urine dipstick LARGE (*)    All other components within normal limits  URINE MICROSCOPIC-ADD ON - Abnormal; Notable for the following:    Squamous Epithelial / LPF 0-5 (*)    Bacteria, UA RARE (*)    All other components within normal limits  BASIC METABOLIC PANEL  CBG MONITORING, ED  CBG MONITORING, ED  I-STAT BETA HCG BLOOD, ED ([facility], [facility], [facility] ONLY)   ____________________________________________  EKG  Reviewed in MUSE. No STEMI.  ____________________________________________  RADIOLOGY  None ____________________________________________   PROCEDURES  Procedure(s) performed:   Procedures  None ____________________________________________   INITIAL IMPRESSION / ASSESSMENT AND PLAN / ED COURSE  Pertinent labs & imaging results that were available during my care of the patient were reviewed by me and considered in my medical decision making (see chart for details).  Patient presents to the emergency department for evaluation of lightheadedness this morning with syncope on standing. Patient also with mild, bitemporal, intermittent headache over the past week. Patient is currently on her menstrual cycle. This has been a particularly Melanie Andrews cycle. Normally has 3 days of bleeding but this month for 7 days of bleeding. Lightheadedness may be secondary to anemia or poor fluid intake. Patient with no signs or symptoms of cardiogenic etiology of syncope. Plan for IV fluids and headache pain control. [facility] with relapsing and remitting, gradually worsening HA that responds to Motrin. Neurological exam is otherwise intact.   05:54 PM Normal orthostatic vital signs.  06:49 PM Patient's headache has resolved after above medications. Urinalysis and labs are normal. Patient with normal EKG. Plan for discharge and primary  care physician follow-up in the next 3-4 days. Discussed return precautions in detail with the patient.  ____________________________________________  FINAL CLINICAL IMPRESSION(S) / ED DIAGNOSES  Final diagnoses:  Syncope and collapse     MEDICATIONS GIVEN DURING THIS VISIT:  Medications  sodium chloride 0.9 % bolus 1,000 mL (0 mLs Intravenous Stopped 07/09/16 1906)  ketorolac (TORADOL) 30 MG/ML injection 30 mg (30 mg Intravenous Given 07/09/16 1748)  metoCLOPramide (REGLAN) injection 10 mg (10 mg Intravenous Given 07/09/16 1748)  diphenhydrAMINE (BENADRYL) injection 50 mg (50 mg Intravenous Given 07/09/16 1748)     NEW OUTPATIENT MEDICATIONS STARTED DURING THIS VISIT:  None   Note:  This document was prepared using Dragon voice recognition software and may include unintentional dictation errors.  Alona BeneJoshua Donnabelle Blanchard, MD Emergency Medicine   Maia PlanJoshua G Janelie Goltz, MD 07/09/16 2218

## 2016-07-09 NOTE — ED Notes (Signed)
Patient able to ambulate independently  

## 2016-07-09 NOTE — ED Notes (Signed)
IV team at bedside 

## 2016-08-14 ENCOUNTER — Ambulatory Visit (HOSPITAL_COMMUNITY)
Admission: EM | Admit: 2016-08-14 | Discharge: 2016-08-14 | Disposition: A | Discharge status: Home / Self Care | Payer: Medicaid Other | Attending: Family Medicine | Admitting: Family Medicine

## 2016-08-14 ENCOUNTER — Encounter (HOSPITAL_COMMUNITY): Payer: Self-pay | Admitting: Family Medicine

## 2016-08-14 DIAGNOSIS — N730 Acute parametritis and pelvic cellulitis: Secondary | ICD-10-CM

## 2016-08-14 DIAGNOSIS — H9201 Otalgia, right ear: Secondary | ICD-10-CM | POA: Diagnosis present

## 2016-08-14 DIAGNOSIS — T700XXA Otitic barotrauma, initial encounter: Secondary | ICD-10-CM

## 2016-08-14 DIAGNOSIS — N898 Other specified noninflammatory disorders of vagina: Secondary | ICD-10-CM | POA: Diagnosis not present

## 2016-08-14 DIAGNOSIS — H6981 Other specified disorders of Eustachian tube, right ear: Secondary | ICD-10-CM

## 2016-08-14 DIAGNOSIS — R102 Pelvic and perineal pain: Secondary | ICD-10-CM

## 2016-08-14 LAB — POCT URINALYSIS DIP (DEVICE)
BILIRUBIN URINE: NEGATIVE
Glucose, UA: NEGATIVE mg/dL
Hgb urine dipstick: NEGATIVE
KETONES UR: NEGATIVE mg/dL
NITRITE: NEGATIVE
PH: 6.5 (ref 5.0–8.0)
Protein, ur: NEGATIVE mg/dL
Specific Gravity, Urine: 1.02 (ref 1.005–1.030)
Urobilinogen, UA: 0.2 mg/dL (ref 0.0–1.0)

## 2016-08-14 LAB — POCT PREGNANCY, URINE: PREG TEST UR: NEGATIVE

## 2016-08-14 MED ORDER — CEFTRIAXONE SODIUM 250 MG IJ SOLR
INTRAMUSCULAR | Status: AC
  Filled 2016-08-14: qty 250

## 2016-08-14 MED ORDER — AZITHROMYCIN 250 MG PO TABS: 1000.0000 mg | ORAL_TABLET | Freq: Once | ORAL | Status: DC

## 2016-08-14 MED ORDER — CEFTRIAXONE SODIUM 250 MG IJ SOLR: 250.0000 mg | Freq: Once | INTRAMUSCULAR | Status: DC

## 2016-08-14 MED ORDER — METRONIDAZOLE 500 MG PO TABS: 500.0000 mg | ORAL_TABLET | Freq: Two times a day (BID) | ORAL | 0 refills | Status: DC

## 2016-08-14 MED ORDER — AZITHROMYCIN 250 MG PO TABS
ORAL_TABLET | ORAL | Status: AC
  Filled 2016-08-14: qty 4

## 2016-08-14 NOTE — ED Triage Notes (Signed)
Pt here for ear pain. Sts also that she has been having vaginal discharge and pain with urination.

## 2016-08-14 NOTE — ED Provider Notes (Signed)
CSN: [id number]     Arrival date & time 08/14/16  1606 History   First MD Initiated Contact with Patient 08/14/16 1750     Chief Complaint  Patient presents with  . Otalgia  . Vaginal Discharge   (Consider location/radiation/quality/duration/timing/severity/associated sxs/prior Treatment) 20 year old female complaining of right ear fullness, feeling clogged and pain beneath the right ear for 2 weeks. Also complaining of dysuria for 2 days, general discharge for 1 day and suprapubic 4/mid pelvic pain today. Positive for vaginal discharge.      History reviewed. No pertinent past medical history. Past Surgical History:  Procedure Laterality Date  . TONSILLECTOMY     History reviewed. No pertinent family history. Social History  Substance Use Topics  . Smoking status: Never Smoker  . Smokeless tobacco: Never Used  . Alcohol use No   OB History    No data available     Review of Systems  Constitutional: Negative for activity change, fatigue and fever.  HENT: Positive for ear pain, postnasal drip and rhinorrhea. Negative for congestion and sore throat.   Eyes: Negative.   Respiratory: Negative for cough and choking.   Cardiovascular: Negative for chest pain.  Gastrointestinal: Negative.   Genitourinary: Positive for dysuria, pelvic pain and vaginal discharge. Negative for flank pain and frequency.  Skin: Negative.   All other systems reviewed and are negative.   Allergies  Latex  Home Medications   Prior to Admission medications   Medication Sig Start Date End Date Taking? Authorizing Provider  ibuprofen (ADVIL,MOTRIN) 200 MG tablet Take 400 mg by mouth 2 (two) times daily as needed for moderate pain.    Historical Provider, MD  metroNIDAZOLE (FLAGYL) 500 MG tablet Take 1 tablet (500 mg total) by mouth 2 (two) times daily. X 7 days 08/14/16   Hayden Rasmussenavid Julies Carmickle, NP  naproxen sodium (ANAPROX) 220 MG tablet Take 440 mg by mouth 2 (two) times daily as needed (for pain).      Historical Provider, MD   Meds Ordered and Administered this Visit   Medications  cefTRIAXone (ROCEPHIN) injection 250 mg (not administered)  azithromycin (ZITHROMAX) tablet 1,000 mg (not administered)    BP 100/58 (BP Location: Left Arm)   Pulse 80   Temp 98.6 F (37 C) (Oral)   Resp 12   SpO2 100%  No data found.   Physical Exam  Constitutional: She is oriented to person, place, and time. She appears well-developed and well-nourished. No distress.  HENT:  Head: Normocephalic and atraumatic.  Right Ear: External ear normal.  Left Ear: External ear normal.  Mouth/Throat: No oropharyngeal exudate.  Left TM normal. Right TM retracted.  Oropharynx with cobblestoning and clear PND.  Eyes: EOM are normal. Pupils are equal, round, and reactive to light.  Neck: Normal range of motion. Neck supple.  Cardiovascular: Normal rate, regular rhythm and normal heart sounds.   Pulmonary/Chest: Effort normal and breath sounds normal. No respiratory distress.  Genitourinary:  Genitourinary Comments: Normal external female genitalia. There is a thin discharge draining from the vaginal orifice. Vaginal vault coated with a thin gray to green bubbly discharge. Relatively small amount. Cervix is far right of midline. Positive for CMT and bilateral adnexal tenderness.  Musculoskeletal: Normal range of motion. She exhibits no edema.  Lymphadenopathy:    She has no cervical adenopathy.  Neurological: She is alert and oriented to person, place, and time.  Skin: Skin is warm and dry.  Nursing note and vitals reviewed.   Urgent Care Course  Clinical Course    Procedures (including critical care time)  Labs Review Labs Reviewed  POCT URINALYSIS DIP (DEVICE) - Abnormal; Notable for the following:       Result Value   Leukocytes, UA TRACE (*)    All other components within normal limits  CERVICOVAGINAL ANCILLARY ONLY    Imaging Review No results found.   Visual Acuity Review  Right  Eye Distance:   Left Eye Distance:   Bilateral Distance:    Right Eye Near:   Left Eye Near:    Bilateral Near:         MDM   1. Otalgia, right   2. Barotitis media, initial encounter   3. ETD (eustachian tube dysfunction), right   4. Vaginal discharge   5. PID (acute pelvic inflammatory disease)   6. Pelvic pain in female    Your ear pain is due to the inflammation and blockage of the eustachian tube which leads to your middle ear. Take Sudafed 10 mg every 4 hours to help decongest the tube and an antihistamine such as Allegra, Claritin or Zyrtec daily to reduce drainage. Drink plenty of fluids stay well-hydrated. May take ibuprofen or Tylenol for discomfort. The pelvic exam suggest that you have an infection in your female organs. Read the directions above for further explanation. Take medication as directed. Meds ordered this encounter  Medications  . cefTRIAXone (ROCEPHIN) injection 250 mg  . azithromycin (ZITHROMAX) tablet 1,000 mg  . metroNIDAZOLE (FLAGYL) 500 MG tablet    Sig: Take 1 tablet (500 mg total) by mouth 2 (two) times daily. X 7 days    Dispense:  14 tablet    Refill:  0    Order Specific Question:   Supervising Provider    Answer:   Linna Hoff [[id number]]       Hayden Rasmussen, NP 08/14/16 [phone]

## 2016-08-14 NOTE — Discharge Instructions (Signed)
Your ear pain is due to the inflammation and blockage of the eustachian tube which leads to your middle ear. Take Sudafed 10 mg every 4 hours to help decongest the tube and an antihistamine such as Allegra, Claritin or Zyrtec daily to reduce drainage. Drink plenty of fluids stay well-hydrated. May take ibuprofen or Tylenol for discomfort. The pelvic exam suggest that you have an infection in your female organs. Read the directions above for further explanation. Take medication as directed.

## 2016-08-15 LAB — CERVICOVAGINAL ANCILLARY ONLY
CHLAMYDIA, DNA PROBE: NEGATIVE
NEISSERIA GONORRHEA: NEGATIVE
Wet Prep (BD Affirm): POSITIVE — AB

## 2016-08-20 ENCOUNTER — Telehealth (HOSPITAL_COMMUNITY): Payer: Self-pay | Admitting: Emergency Medicine

## 2016-08-20 NOTE — Telephone Encounter (Signed)
-----   Message from Eustace MooreLaura W Murray, MD sent at 08/17/2016  2:13 PM EDT ----- Please let patient know that test for gardnerella (bacterial vaginosis) was positive.  Rx for metronidazole was given at urgent care visit 08/14/16.  Recheck or followup PCP/Timothy Dow AdolphVan Noy for further evaluation if symptoms persist.  LM

## 2016-08-20 NOTE — Telephone Encounter (Signed)
Called pt and notified of recent lab results Pt ID'd properly... Reports feeling better and sx have subsided States she's taking Flagyl and tolerating well.  Adv pt if sx are not getting better to return or to f/u w/PCP Education on safe sex given Pt verb understanding.

## 2016-09-17 ENCOUNTER — Ambulatory Visit (HOSPITAL_COMMUNITY)
Admission: EM | Admit: 2016-09-17 | Discharge: 2016-09-17 | Disposition: A | Discharge status: Home / Self Care | Payer: Medicaid Other | Attending: Emergency Medicine | Admitting: Emergency Medicine

## 2016-09-17 ENCOUNTER — Encounter (HOSPITAL_COMMUNITY): Payer: Self-pay | Admitting: Emergency Medicine

## 2016-09-17 DIAGNOSIS — N730 Acute parametritis and pelvic cellulitis: Secondary | ICD-10-CM

## 2016-09-17 DIAGNOSIS — N898 Other specified noninflammatory disorders of vagina: Secondary | ICD-10-CM | POA: Diagnosis not present

## 2016-09-17 DIAGNOSIS — N739 Female pelvic inflammatory disease, unspecified: Secondary | ICD-10-CM | POA: Insufficient documentation

## 2016-09-17 LAB — POCT URINALYSIS DIP (DEVICE)
Bilirubin Urine: NEGATIVE
GLUCOSE, UA: NEGATIVE mg/dL
Hgb urine dipstick: NEGATIVE
KETONES UR: NEGATIVE mg/dL
Nitrite: NEGATIVE
Protein, ur: NEGATIVE mg/dL
SPECIFIC GRAVITY, URINE: 1.025 (ref 1.005–1.030)
Urobilinogen, UA: 0.2 mg/dL (ref 0.0–1.0)
pH: 6.5 (ref 5.0–8.0)

## 2016-09-17 LAB — POCT PREGNANCY, URINE: Preg Test, Ur: NEGATIVE

## 2016-09-17 MED ORDER — DOXYCYCLINE HYCLATE 100 MG PO CAPS: 100.0000 mg | ORAL_CAPSULE | Freq: Two times a day (BID) | ORAL | 0 refills | Status: DC

## 2016-09-17 MED ORDER — CEFTRIAXONE SODIUM 250 MG IJ SOLR
INTRAMUSCULAR | Status: AC
  Filled 2016-09-17: qty 250

## 2016-09-17 MED ORDER — CEFTRIAXONE SODIUM 250 MG IJ SOLR: 250.0000 mg | Freq: Once | INTRAMUSCULAR | Status: DC

## 2016-09-17 MED ORDER — ONDANSETRON 4 MG PO TBDP: 4.0000 mg | ORAL_TABLET | Freq: Three times a day (TID) | ORAL | 0 refills | Status: DC | PRN

## 2016-09-17 NOTE — ED Triage Notes (Signed)
[facility] a [facility] of a vaginal discharge, lower abdominal pain, low back pain and dysuria that started 1 week ago.

## 2016-09-17 NOTE — ED Provider Notes (Signed)
CSN: [id number]     Arrival date & time 09/17/16  1346 History   None    Chief Complaint  Patient presents with  . Vaginal Discharge  . Dysuria   (Consider location/radiation/quality/duration/timing/severity/associated sxs/prior Treatment) Patient c/o vaginal discharge and pelvic pain for a week.  She denies dysuria and she does c/o LBP.  She states she is feeling nauseated.  Patient also c/o nausea.   The history is provided by the patient.  Vaginal Discharge  Quality:  White Severity:  Moderate Onset quality:  Sudden Duration:  1 week Timing:  Constant Progression:  Worsening Chronicity:  New Context: after intercourse   Relieved by:  Nothing Worsened by:  Nothing Ineffective treatments:  None tried Associated symptoms: dysuria   Dysuria  Associated symptoms: vaginal discharge     History reviewed. No pertinent past medical history. Past Surgical History:  Procedure Laterality Date  . TONSILLECTOMY     History reviewed. No pertinent family history. Social History  Substance Use Topics  . Smoking status: Never Smoker  . Smokeless tobacco: Never Used  . Alcohol use No   OB History    No data available     Review of Systems  Constitutional: Negative.   HENT: Negative.   Eyes: Negative.   Respiratory: Negative.   Cardiovascular: Negative.   Gastrointestinal: Negative.   Endocrine: Negative.   Genitourinary: Positive for dysuria and vaginal discharge.  Musculoskeletal: Negative.   Skin: Negative.   Allergic/Immunologic: Negative.   Neurological: Negative.   Hematological: Negative.   Psychiatric/Behavioral: Negative.     Allergies  Latex  Home Medications   Prior to Admission medications   Medication Sig Start Date End Date Taking? Authorizing Provider  ibuprofen (ADVIL,MOTRIN) 200 MG tablet Take 400 mg by mouth 2 (two) times daily as needed for moderate pain.   Yes Historical Provider, MD  doxycycline (VIBRAMYCIN) 100 MG capsule Take 1 capsule  (100 mg total) by mouth 2 (two) times daily. 09/17/16   Deatra CanterWilliam J Scharlene Catalina, FNP  metroNIDAZOLE (FLAGYL) 500 MG tablet Take 1 tablet (500 mg total) by mouth 2 (two) times daily. X 7 days 08/14/16   Hayden Rasmussenavid Mabe, NP  naproxen sodium (ANAPROX) 220 MG tablet Take 440 mg by mouth 2 (two) times daily as needed (for pain).     Historical Provider, MD  ondansetron (ZOFRAN ODT) 4 MG disintegrating tablet Take 1 tablet (4 mg total) by mouth every 8 (eight) hours as needed for nausea or vomiting. 09/17/16   Deatra CanterWilliam J Elica Almas, FNP   Meds Ordered and Administered this Visit   Medications  cefTRIAXone (ROCEPHIN) injection 250 mg (not administered)    BP 113/67 (BP Location: Left Arm)   Pulse 92   Temp 98.6 F (37 C) (Oral)   Resp 16   LMP 08/20/2016 (Exact Date)   SpO2 100%  No data found.   Physical Exam  Constitutional: She is oriented to person, place, and time. She appears well-developed and well-nourished.  HENT:  Head: Normocephalic and atraumatic.  Eyes: EOM are normal. Pupils are equal, round, and reactive to light.  Neck: Normal range of motion. Neck supple.  Cardiovascular: Normal rate, regular rhythm and normal heart sounds.   Pulmonary/Chest: Effort normal and breath sounds normal.  Abdominal: Soft. Bowel sounds are normal.  Genitourinary: Vaginal discharge found.  Genitourinary Comments: Positive CMT  Musculoskeletal: Normal range of motion.  Neurological: She is alert and oriented to person, place, and time.  Nursing note and vitals reviewed.   Urgent Care  Course   Clinical Course    Procedures (including critical care time)  Labs Review Labs Reviewed  POCT URINALYSIS DIP (DEVICE) - Abnormal; Notable for the following:       Result Value   Leukocytes, UA TRACE (*)    All other components within normal limits  POCT PREGNANCY, URINE  CERVICOVAGINAL ANCILLARY ONLY    Imaging Review No results found.   Visual Acuity Review  Right Eye Distance:   Left Eye Distance:    Bilateral Distance:    Right Eye Near:   Left Eye Near:    Bilateral Near:         MDM   1. PID (acute pelvic inflammatory disease)   2. Vaginal discharge    Rocephin 250mg  IM Doxycycline 100mg  one po bid x 14 days #14 Advised to use protection      Deatra CanterWilliam J Lashe Oliveira, FNP 09/17/16 1524

## 2016-09-18 LAB — CERVICOVAGINAL ANCILLARY ONLY
Chlamydia: NEGATIVE
Neisseria Gonorrhea: NEGATIVE
Wet Prep (BD Affirm): NEGATIVE

## 2016-09-20 ENCOUNTER — Ambulatory Visit: Payer: Medicaid Other | Admitting: Advanced Practice Midwife

## 2016-09-21 ENCOUNTER — Telehealth: Payer: Self-pay | Admitting: *Deleted

## 2016-09-21 NOTE — Telephone Encounter (Signed)
Pt called to office, no message left.  Attempt to return call. No answer, LM on VM to call back.

## 2016-09-24 ENCOUNTER — Telehealth (HOSPITAL_COMMUNITY): Payer: Self-pay | Admitting: Emergency Medicine

## 2016-09-24 NOTE — Telephone Encounter (Signed)
-----   Message from Domenick GongAshley Mortenson, MD sent at 09/18/2016 12:19 PM EDT ----- Gerilyn Nestleamon- We need to change her doxycycline rx from 100 mg qd x 14 days to 100 mg bid x 14 days .   Could you please contact her and change her rx to doxycycline 100 mg po bid x 14 days #28. Generic ok no refills. Call in to pharmacy of pt's choice. Also please let me know when this is done.   Thanks- Morrie SheldonAshley

## 2016-09-24 NOTE — Telephone Encounter (Signed)
LM on [phone] Need to clarify if pt is taking doxy 100 mg  Need to give lab results and to see how pt is doing from recent visit on 10/30 Also let pt know labs can be obtained from MyChart

## 2016-10-08 ENCOUNTER — Ambulatory Visit: Payer: Medicaid Other | Admitting: Certified Nurse Midwife

## 2016-10-18 ENCOUNTER — Ambulatory Visit (INDEPENDENT_AMBULATORY_CARE_PROVIDER_SITE_OTHER): Payer: Medicaid Other | Admitting: Obstetrics and Gynecology

## 2016-10-18 ENCOUNTER — Encounter: Payer: Self-pay | Admitting: Obstetrics and Gynecology

## 2016-10-18 VITALS — BP 97/60 | HR 80 | Temp 97.6°F | Ht 62.0 in | Wt 117.2 lb

## 2016-10-18 DIAGNOSIS — Z87898 Personal history of other specified conditions: Secondary | ICD-10-CM | POA: Diagnosis not present

## 2016-10-18 DIAGNOSIS — R109 Unspecified abdominal pain: Secondary | ICD-10-CM | POA: Diagnosis not present

## 2016-10-18 DIAGNOSIS — Z30011 Encounter for initial prescription of contraceptive pills: Secondary | ICD-10-CM | POA: Diagnosis not present

## 2016-10-18 MED ORDER — LO LOESTRIN FE 1 MG-10 MCG / 10 MCG PO TABS: 1.0000 | ORAL_TABLET | Freq: Every day | ORAL | 3 refills | Status: DC

## 2016-10-18 NOTE — Patient Instructions (Signed)
Find out if you've had the HPV vaccine already.  HPV (Human Papillomavirus) Vaccine: What You Need to Know 1. Why get vaccinated? HPV vaccine prevents infection with human papillomavirus (HPV) types that are associated with many cancers, including:  cervical cancer in females,  vaginal and vulvar cancers in females,  anal cancer in females and males,  throat cancer in females and males, and  penile cancer in males. In addition, HPV vaccine prevents infection with HPV types that cause genital warts in both females and males. In the [place] about 12,000 women get cervical cancer every year, and about 4,000 women die from it. HPV vaccine can prevent most of these cases of cervical cancer. Vaccination is not a substitute for cervical cancer screening. This vaccine does not protect against all HPV types that can cause cervical cancer. Women should still get regular Pap tests.  HPV infection usually comes from sexual contact, and most people will become infected at some point in their life. About 14 million Americans, including teens, get infected every year. Most infections will go away on their own and not cause serious problems. But thousands of women and men get cancer and other diseases from HPV. 2. HPV vaccine HPV vaccine is approved by FDA and is recommended by CDC for both males and females. It is routinely given at 1311 or 19 years of age, but it may be given beginning at age 319 years through age 19 years. Most adolescents 9 through 19 years of age should get HPV vaccine as a two-dose series with the doses separated by 6-12 months. People who start HPV vaccination at 19 years of age and older should get the vaccine as a three-dose series with the second dose given 1-2 months after the first dose and the third dose given 6 months after the first dose. There are several exceptions to these age recommendations. Your health care provider can give you more information. 3. Some people should not get  this vaccine  Anyone who has had a severe (life-threatening) allergic reaction to a dose of HPV vaccine should not get another dose.  Anyone who has a severe (life threatening) allergy to any component of HPV vaccine should not get the vaccine.  Tell your doctor if you have any severe allergies that you know of, including a severe allergy to yeast.  HPV vaccine is not recommended for pregnant women. If you learn that you were pregnant when you were vaccinated, there is no reason to expect any problems for you or your baby. Any woman who learns she was pregnant when she got HPV vaccine is encouraged to contact the manufacturer's registry for HPV vaccination during pregnancy at [phone]. Women who are breastfeeding may be vaccinated.  If you have a mild illness, such as a cold, you can probably get the vaccine today. If you are moderately or severely ill, you should probably wait until you recover. Your doctor can advise you. 4. Risks of a vaccine reaction With any medicine, including vaccines, there is a chance of side effects. These are usually mild and go away on their own, but serious reactions are also possible. Most people who get HPV vaccine do not have any serious problems with it. Mild or moderate problems following HPV vaccine:  Reactions in the arm where the shot was given:  Soreness (about 9 people in 10)  Redness or swelling (about 1 person in 3)  Fever:  Mild (100F) (about 1 person in 10)  Moderate (102F) (about 1 person  in 5165)  Other problems:  Headache (about 1 person in 3) Problems that could happen after any injected vaccine:  People sometimes faint after a medical procedure, including vaccination. Sitting or lying down for about 15 minutes can help prevent fainting, and injuries caused by a fall. Tell your doctor if you feel dizzy, or have vision changes or ringing in the ears.  Some people get severe pain in the shoulder and have difficulty moving the arm  where a shot was given. This happens very rarely.  Any medication can cause a severe allergic reaction. Such reactions from a vaccine are very rare, estimated at about 1 in a million doses, and would happen within a few minutes to a few hours after the vaccination. As with any medicine, there is a very remote chance of a vaccine causing a serious injury or death. The safety of vaccines is always being monitored. For more information, visit: http://floyd.org/www.cdc.gov/vaccinesafety/. 5. What if there is a serious reaction? What should I look for? Look for anything that concerns you, such as signs of a severe allergic reaction, very high fever, or unusual behavior. Signs of a severe allergic reaction can include hives, swelling of the face and throat, difficulty breathing, a fast heartbeat, dizziness, and weakness. These would usually start a few minutes to a few hours after the vaccination. What should I do? If you think it is a severe allergic reaction or other emergency that can't wait, call 9-1-1 or get to the nearest hospital. Otherwise, call your doctor. Afterward, the reaction should be reported to the Vaccine Adverse Event Reporting System (VAERS). Your doctor should file this report, or you can do it yourself through the VAERS web site at www.vaers.LAgents.nohhs.gov, or by calling 1-9515609950. VAERS does not give medical advice. 6. The National Vaccine Injury Compensation Program The [employer] Vaccine Injury Compensation Program (VICP) is a federal program that was created to compensate people who may have been injured by certain vaccines. Persons who believe they may have been injured by a vaccine can learn about the program and about filing a claim by calling 1-720-316-0857 or visiting the VICP website at SpiritualWord.atwww.hrsa.gov/vaccinecompensation. There is a time limit to file a claim for compensation. 7. How can I learn more?  Ask your health care provider. He or she can give you the vaccine package insert or suggest  other sources of information.  Call your local or state health department.  Contact the Centers for Disease Control and Prevention (CDC):  Call 936-427-17121-501-618-0190 (1-800-CDC-INFO) or  Visit CDC's website at RunningConvention.dewww.cdc.gov/hpv Vaccine Information Statement, HPV Vaccine (10/21/2015) This information is not intended to replace advice given to you by your health care provider. Make sure you discuss any questions you have with your health care provider. Document Released: 06/02/2014 Document Revised: 07/26/2016 Document Reviewed: 07/26/2016 Elsevier Interactive Patient Education  2017 [employer]

## 2016-10-18 NOTE — Progress Notes (Signed)
Patient is in the office for follow up. 

## 2016-10-18 NOTE — Telephone Encounter (Signed)
Pt called back... States she did take doxy 100 mg BID and was able to finish and tolerate them well.   States she is feeling better.   Adv pt to f/u w/PCP or to return if sx arise again.   Pt verb understanding.

## 2016-10-18 NOTE — Progress Notes (Signed)
Obstetrics and Gynecology Visit New Patient Evaluation  Appointment Date: 10/18/2016  [facility]  Primary Care Provider: Lurlean NannyVAN Andrews, Melanie Andrews  Referring Provider: [facility]  Chief Complaint: follow up ED visit  History of Present Illness: Melanie Andrews is a 20 y.o. biracial NG0 (Patient's last menstrual period was 10/16/2016.), seen for the above chief complaint. Her past medical history is significant for nothing   Patient [facility] for abdominal pain and was treated with outpatient PID regimen of rocephin and doxy; original CC was for abdominal pain and discharge and she had +discharge and CMT per the notes from the ER.  GC/CT/trich/candida and UPT were negative; no u/s was ordered.  She states her s/s began about 1wk before and was getting worse, before presenting to the ED.  She states that her s/s resolved about a week after being seen and getting treated in the ED.    She is interested in contraception  No  fevers, chills, chest pain, SOB, nausea, vomiting, abdominal pain, dysuria, hematuria, vaginal itching, diarrhea, constipation, blood in BMs  Review of Systems: as noted in the History of Present Illness.   Past Medical History:  No past medical history on file.  Past Surgical History:  Past Surgical History:  Procedure Laterality Date  . TONSILLECTOMY      Past Obstetrical History:  OB History  Gravida Para Term Preterm AB Living  0 0 0 0 0 0  SAB TAB Ectopic Multiple Live Births  0 0 0 0 0        Past Gynecological History: As per HPI. No h/o STIs No h/o pap smears Was on the patch in the past x 3 years. She states her periods come on q month for about 5 days and no intermenstrual bleeding and not particularly heavy or painful.    Social History:  Social History   Social History  . Marital status: Single    Spouse name: N/A  . Number of children: N/A  . Years of education: N/A   Occupational History   . Not on file.   Social History Main Topics  . Smoking status: Never Smoker  . Smokeless tobacco: Never Used  . Alcohol use No  . Drug use:     Frequency: 5.0 times per week    Types: Marijuana  . Sexual activity: Yes    Birth control/ protection: Condom   Other Topics Concern  . Not on file   Social History Narrative  . No narrative on file    Family History: She denies any female cancers, bleeding or blood clotting disorders.    Medications None  Allergies Latex   Physical Exam:  BP 97/60   Pulse 80   Temp 97.6 F (36.4 C) (Oral)   Ht 5\' 2"  (1.575 m)   Wt 117 lb 3.2 oz (53.2 kg)   LMP 10/16/2016   BMI 21.44 kg/m  Body mass index is 21.44 kg/m. General appearance: Well nourished, well developed female in no acute distress.  Neck:  Supple, normal appearance, and no thyromegaly  Cardiovascular: normal s1 and s2.  No murmurs, rubs or gallops. Respiratory:  Clear to auscultation bilateral. Normal respiratory effort Abdomen: positive bowel sounds and no masses, hernias; diffusely non tender to palpation, non distended Neuro/Psych:  Normal mood and affect.  Skin:  Warm and dry.  Lymphatic:  No inguinal lymphadenopathy.   Pelvic exam: deferred  Laboratory: as above.   Radiology: normal 03/2013  CT A/P w/o contrast done for left flank or back pain.   Assessment: Patient doing well  Plan:  I told her that it was reasonable to treat her for PID and recommended barrier methods to avoid STIs. Serum testing offered to her but she declines BC options d/w pt, including r/b/a and she'd like to try the pill and will start on lo loestrin and pt okay to start today since she is on her period or can do Sunday start but told to wait one week before considering effective.  Pt told to find out from former peds doctor if she's had the HPV vaccine and if not to let us know when we see her back for her annual/follow up on how she likes the pills in a few months  RTC  2-3520m  Melanie Copaharlie Eulala Andrews, Jr MD Attending [facility](Faculty Practice)

## 2016-10-22 ENCOUNTER — Encounter: Payer: Self-pay | Admitting: Obstetrics and Gynecology

## 2016-10-22 DIAGNOSIS — Z87898 Personal history of other specified conditions: Secondary | ICD-10-CM | POA: Insufficient documentation

## 2016-10-22 DIAGNOSIS — Z30011 Encounter for initial prescription of contraceptive pills: Secondary | ICD-10-CM | POA: Insufficient documentation

## 2016-11-01 ENCOUNTER — Ambulatory Visit (INDEPENDENT_AMBULATORY_CARE_PROVIDER_SITE_OTHER): Payer: Medicaid Other

## 2016-11-01 ENCOUNTER — Ambulatory Visit (HOSPITAL_COMMUNITY)
Admission: EM | Admit: 2016-11-01 | Discharge: 2016-11-01 | Disposition: A | Discharge status: Home / Self Care | Payer: Medicaid Other | Attending: Emergency Medicine | Admitting: Emergency Medicine

## 2016-11-01 ENCOUNTER — Encounter (HOSPITAL_COMMUNITY): Payer: Self-pay | Admitting: Emergency Medicine

## 2016-11-01 DIAGNOSIS — S93601A Unspecified sprain of right foot, initial encounter: Secondary | ICD-10-CM

## 2016-11-01 NOTE — Discharge Instructions (Signed)
Rest,, ice, elevation and wear the wrap for 3-4 days. Do not perform any running or jumping for 7-10 days. Limit weightbearing as much as possible.
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
## 2017-05-13 ENCOUNTER — Ambulatory Visit: Payer: BLUE CROSS/BLUE SHIELD | Admitting: Obstetrics

## 2017-05-29 ENCOUNTER — Ambulatory Visit: Payer: BLUE CROSS/BLUE SHIELD | Admitting: Obstetrics

## 2017-07-01 ENCOUNTER — Encounter (HOSPITAL_COMMUNITY): Payer: Self-pay | Admitting: *Deleted

## 2017-07-01 ENCOUNTER — Ambulatory Visit (HOSPITAL_COMMUNITY)
Admission: EM | Admit: 2017-07-01 | Discharge: 2017-07-01 | Disposition: A | Discharge status: Home / Self Care | Payer: Self-pay | Attending: Family Medicine | Admitting: Family Medicine

## 2017-07-01 DIAGNOSIS — Z9889 Other specified postprocedural states: Secondary | ICD-10-CM | POA: Insufficient documentation

## 2017-07-01 DIAGNOSIS — N3090 Cystitis, unspecified without hematuria: Secondary | ICD-10-CM | POA: Insufficient documentation

## 2017-07-01 DIAGNOSIS — Z8249 Family history of ischemic heart disease and other diseases of the circulatory system: Secondary | ICD-10-CM | POA: Insufficient documentation

## 2017-07-01 DIAGNOSIS — Z79899 Other long term (current) drug therapy: Secondary | ICD-10-CM | POA: Insufficient documentation

## 2017-07-01 DIAGNOSIS — R300 Dysuria: Secondary | ICD-10-CM

## 2017-07-01 DIAGNOSIS — Z8619 Personal history of other infectious and parasitic diseases: Secondary | ICD-10-CM | POA: Insufficient documentation

## 2017-07-01 DIAGNOSIS — Z3202 Encounter for pregnancy test, result negative: Secondary | ICD-10-CM

## 2017-07-01 DIAGNOSIS — B001 Herpesviral vesicular dermatitis: Secondary | ICD-10-CM | POA: Insufficient documentation

## 2017-07-01 DIAGNOSIS — R350 Frequency of micturition: Secondary | ICD-10-CM | POA: Insufficient documentation

## 2017-07-01 LAB — POCT URINALYSIS DIP (DEVICE)
Bilirubin Urine: NEGATIVE
Glucose, UA: NEGATIVE mg/dL
Hgb urine dipstick: NEGATIVE
KETONES UR: NEGATIVE mg/dL
Nitrite: NEGATIVE
Protein, ur: NEGATIVE mg/dL
Specific Gravity, Urine: 1.02 (ref 1.005–1.030)
UROBILINOGEN UA: 0.2 mg/dL (ref 0.0–1.0)
pH: 7 (ref 5.0–8.0)

## 2017-07-01 LAB — POCT PREGNANCY, URINE: PREG TEST UR: NEGATIVE

## 2017-07-01 MED ORDER — ACYCLOVIR 400 MG PO TABS
400.0000 mg | ORAL_TABLET | Freq: Three times a day (TID) | ORAL | 0 refills | Status: AC
Start: 2017-07-01 — End: 2017-07-08

## 2017-07-01 MED ORDER — CEPHALEXIN 500 MG PO CAPS: 500.0000 mg | ORAL_CAPSULE | Freq: Two times a day (BID) | ORAL | 0 refills | Status: AC

## 2017-07-01 NOTE — ED Triage Notes (Signed)
Dysuria x 3 days. No vaginal discharge. Sexually active.   Patient also reports bump on mouth, non painful, not sure what it is. Causes swelling to upper right side of mouth. No drainage.

## 2017-07-01 NOTE — ED Provider Notes (Signed)
[facility]    CSN: [id number] Arrival date & time: 07/01/17  1543     History   Chief Complaint Chief Complaint  Patient presents with  . Dysuria  . Mouth Lesions    HPI Melanie Andrews is a 21 y.o. female.   21 year old female comes in for multiple complaints.  1. 3 day history of dysuria and urinary frequency. She states she had a fever of 101 5 days ago, which has since resolved. Denies hematuria, back pain. Sexually active with one partner, no condom use. Last menstrual cycle 06/23/2017. Denies vaginal discharge, itchiness/pain, spotting. Denies abdominal pain, nausea, vomiting.   2. One-day history of lesion on her lip. Some burning and itching sensation. Denies URI symptoms such as cough, congestion, sore throat. Denies lesion in her mouth, palms, feet. Said history of cold sores.       History reviewed. No pertinent past medical history.  Patient Active Problem List   Diagnosis Date Noted  . History of abdominal pain 10/22/2016  . Encounter for initial prescription of contraceptive pills 10/22/2016    Past Surgical History:  Procedure Laterality Date  . TONSILLECTOMY      OB History    Gravida Para Term Preterm AB Living   0 0 0 0 0 0   SAB TAB Ectopic Multiple Live Births   0 0 0 0 0       Home Medications    Prior to Admission medications   Medication Sig Start Date End Date Taking? Authorizing Provider  ibuprofen (ADVIL,MOTRIN) 200 MG tablet Take 400 mg by mouth 2 (two) times daily as needed for moderate pain.   Yes [provider]  acyclovir (ZOVIRAX) 400 MG tablet Take 1 tablet (400 mg total) by mouth 3 (three) times daily. 07/01/17 07/08/17  Belinda FisherYu, Tylen Leverich V, PA-C  cephALEXin (KEFLEX) 500 MG capsule Take 1 capsule (500 mg total) by mouth 2 (two) times daily. 07/01/17 07/06/17  Belinda FisherYu, Dylin Ihnen V, PA-C    Family History Family History  Problem Relation Age of Onset  . Hypertension Mother     Social History Social History  Substance Use  Topics  . Smoking status: Never Smoker  . Smokeless tobacco: Never Used  . Alcohol use No     Allergies   Latex   Review of Systems Review of Systems  Reason unable to perform ROS: See HPI as above.     Physical Exam Triage Vital Signs ED Triage Vitals  Enc Vitals Group     BP      Pulse      Resp      Temp      Temp src      SpO2      Weight      Height      Head Circumference      Peak Flow      Pain Score      Pain Loc      Pain Edu?      Excl. in GC?    No data found.   Updated Vital Signs LMP 06/23/2017   Physical Exam  Constitutional: She appears well-developed and well-nourished. No distress.  HENT:  Head: Normocephalic and atraumatic.  Mouth/Throat: Uvula is midline, oropharynx is clear and moist and mucous membranes are normal.  Single intact vesicle of the right upper lip. No surrounding erythema, increased warmth, discharge.   Cardiovascular: Normal rate, regular rhythm and normal heart sounds.  Exam reveals no gallop and no friction  rub.   No murmur heard. Pulmonary/Chest: Effort normal and breath sounds normal. She has no wheezes. She has no rales.  Abdominal: Soft. Bowel sounds are normal. She exhibits no mass. There is no tenderness. There is no rebound, no guarding and no CVA tenderness.     UC Treatments / Results  Labs (all labs ordered are listed, but only abnormal results are displayed) Labs Reviewed  POCT URINALYSIS DIP (DEVICE) - Abnormal; Notable for the following:       Result Value   Leukocytes, UA TRACE (*)    All other components within normal limits  URINE CULTURE  POCT PREGNANCY, URINE    EKG  EKG Interpretation None       Radiology No results found.  Procedures Procedures (including critical care time)  Medications Ordered in UC Medications - No data to display   Initial Impression / Assessment and Plan / UC Course  I have reviewed the triage vital signs and the nursing notes.  Pertinent labs &  imaging results that were available during my care of the patient were reviewed by me and considered in my medical decision making (see chart for details).    Discussed lab results with patient, urine positive for UTI. Start Keflex as directed. Discussed with patient can use topical or oral medication for cold sores given onset of symptoms less than 72 hours, patient prefers to take oral medication. Star acyclovir as directed. Urine culture sent, patient will be contacted with any positive results and changes needed. Return precautions given.  Final Clinical Impressions(s) / UC Diagnoses   Final diagnoses:  Cystitis  Cold sore    New Prescriptions New Prescriptions   ACYCLOVIR (ZOVIRAX) 400 MG TABLET    Take 1 tablet (400 mg total) by mouth 3 (three) times daily.   CEPHALEXIN (KEFLEX) 500 MG CAPSULE    Take 1 capsule (500 mg total) by mouth 2 (two) times daily.      Belinda Fisher, PA-C 07/01/17 1708

## 2017-07-01 NOTE — Discharge Instructions (Signed)
Urine negative for pregnancy, but positive for UTI. Start Keflex as directed. Acyclovir for cold sores. Monitor for worsening of symptoms, back pain, fever, follow-up here or with PCP for reevaluation.

## 2017-07-03 LAB — URINE CULTURE: Culture: NO GROWTH

## 2017-12-15 ENCOUNTER — Ambulatory Visit (HOSPITAL_COMMUNITY)
Admission: AD | Admit: 2017-12-15 | Discharge: 2017-12-15 | Disposition: A | Discharge status: Home / Self Care | Payer: Self-pay | Attending: Psychiatry | Admitting: Psychiatry

## 2017-12-15 DIAGNOSIS — F332 Major depressive disorder, recurrent severe without psychotic features: Secondary | ICD-10-CM | POA: Insufficient documentation
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
## 2017-12-15 NOTE — H&P (Signed)
Behavioral Health Medical Screening Exam  Melanie Andrews is an 22 y.o. female who [facility]. Reports that she was feeling overwhelmed earlier and decided to come in and get evaluated. States that she has been becoming  increasingly depressed and anxious over the past couple of years. Reports occasional suicidal thoughts, but denies any intent or plan. Denies any audiovisual hallucinations.   Total Time spent with patient: 20 minutes  Psychiatric Specialty Exam: Physical Exam  Constitutional: She is oriented to person, place, and time. She appears well-developed and well-nourished. No distress.  HENT:  Head: Normocephalic and atraumatic.  Right Ear: External ear normal.  Left Ear: External ear normal.  Eyes: Conjunctivae are normal. Right eye exhibits no discharge. Left eye exhibits no discharge. No scleral icterus.  Cardiovascular: Normal rate.  Respiratory: Effort normal. No respiratory distress.  Musculoskeletal: Normal range of motion.  Neurological: She is alert and oriented to person, place, and time.  Skin: Skin is warm. She is not diaphoretic.    Review of Systems  Psychiatric/Behavioral: Positive for depression and suicidal ideas. Negative for hallucinations, memory loss and substance abuse. The patient is nervous/anxious. The patient does not have insomnia.   All other systems reviewed and are negative.   Blood pressure 101/63, pulse 88, temperature 98.6 F (37 C), resp. rate 16, SpO2 100 %.There is no height or weight on file to calculate BMI.  General Appearance: Casual and Well Groomed  Eye Contact:  Good  Speech:  Clear and Coherent and Normal Rate  Volume:  Normal  Mood:  Anxious and Depressed  Affect:  Depressed  Thought Process:  Coherent, Goal Directed and Descriptions of Associations: Intact  Orientation:  Full (Time, Place, and Person)  Thought Content:  Logical and Hallucinations: None  Suicidal Thoughts:  Reports suicidal thoughts at times,  denies any intent or plans. Denies current SI.  Homicidal Thoughts:  No  Memory:  Immediate;   Good Recent;   Good  Judgement:  Good  Insight:  Fair  Psychomotor Activity:  Normal  Concentration: Concentration: Good and Attention Span: Good  Recall:  Good  Fund of Knowledge:Good  Language: Good  Akathisia:  NA  Handed:  Right  AIMS (if indicated):     Assets:  Communication Skills Desire for Improvement Financial Resources/Insurance Housing Intimacy Leisure Time Physical Health Resilience Transportation  Sleep:       Musculoskeletal: Strength & Muscle Tone: within normal limits Gait & Station: normal   Blood pressure 101/63, pulse 88, temperature 98.6 F (37 C), resp. rate 16, SpO2 100 %.  Recommendations:  Based on my evaluation the patient does not appear to have an emergency medical condition. Patient states that she feels safe retuning home. [facility] contacting [facility], or going to the ED if she feels suicidal. TTS provided with outpatient resources.   Jackelyn PolingJason A Saryiah Bencosme, NP 12/15/2017, 2:01 AM

## 2017-12-16 ENCOUNTER — Telehealth: Payer: Self-pay | Admitting: Pediatrics

## 2017-12-16 MED ORDER — FLUCONAZOLE 150 MG PO TABS: 150.0000 mg | ORAL_TABLET | Freq: Once | ORAL | 0 refills | Status: AC

## 2017-12-16 NOTE — Telephone Encounter (Signed)
Pt called in reporting yeast infection.  Diflucan sent per standing order.

## 2018-03-05 ENCOUNTER — Ambulatory Visit (HOSPITAL_COMMUNITY)
Admission: EM | Admit: 2018-03-05 | Discharge: 2018-03-05 | Disposition: A | Discharge status: Home / Self Care | Payer: Self-pay | Attending: Family Medicine | Admitting: Family Medicine

## 2018-03-05 ENCOUNTER — Encounter (HOSPITAL_COMMUNITY): Payer: Self-pay | Admitting: Family Medicine

## 2018-03-05 DIAGNOSIS — N898 Other specified noninflammatory disorders of vagina: Secondary | ICD-10-CM | POA: Insufficient documentation

## 2018-03-05 MED ORDER — FLUCONAZOLE 150 MG PO TABS: 150.0000 mg | ORAL_TABLET | Freq: Every day | ORAL | 0 refills | Status: DC

## 2018-03-05 NOTE — ED Provider Notes (Signed)
[facility]    CSN: [id number] Arrival date & time: 03/05/18  1755     History   Chief Complaint Chief Complaint  Patient presents with  . Vaginitis    HPI Melanie Andrews is a 22 y.o. female.   22 year old female comes in for a few day history of vaginal itching and discharge.  States that tried Monistat with some improvement, but itching returned.  Recently switched to a new soap.  Denies fever, chills, night sweats.  Denies abdominal pain, nausea, vomiting.  Denies urinary symptoms such as frequency, dysuria, hematuria.  Sexually active with one female partner, consistent condom use.  LMP 02/07/2017.     History reviewed. No pertinent past medical history.  Patient Active Problem List   Diagnosis Date Noted  . History of abdominal pain 10/22/2016  . Encounter for initial prescription of contraceptive pills 10/22/2016    Past Surgical History:  Procedure Laterality Date  . TONSILLECTOMY      OB History    Gravida  0   Para  0   Term  0   Preterm  0   AB  0   Living  0     SAB  0   TAB  0   Ectopic  0   Multiple  0   Live Births  0            Home Medications    Prior to Admission medications   Medication Sig Start Date End Date Taking? Authorizing Provider  fluconazole (DIFLUCAN) 150 MG tablet Take 1 tablet (150 mg total) by mouth daily. Take second dose 72 hours later if symptoms still persists. 03/05/18   Cathie HoopsYu, Nerine Pulse V, PA-C  ibuprofen (ADVIL,MOTRIN) 200 MG tablet Take 400 mg by mouth 2 (two) times daily as needed for moderate pain.    [provider]    Family History Family History  Problem Relation Age of Onset  . Hypertension Mother     Social History Social History   Tobacco Use  . Smoking status: Never Smoker  . Smokeless tobacco: Never Used  Substance Use Topics  . Alcohol use: No  . Drug use: Yes    Frequency: 5.0 times per week    Types: Marijuana     Allergies   Latex   Review of  Systems Review of Systems  Reason unable to perform ROS: See HPI as above.     Physical Exam Triage Vital Signs ED Triage Vitals  Enc Vitals Group     BP 03/05/18 1838 (!) 103/57     Pulse Rate 03/05/18 1838 70     Resp 03/05/18 1838 18     Temp 03/05/18 1838 98.7 F (37.1 C)     Temp src --      SpO2 03/05/18 1838 100 %     Weight --      Height --      Head Circumference --      Peak Flow --      Pain Score 03/05/18 1836 0     Pain Loc --      Pain Edu? --      Excl. in GC? --    No data found.  Updated Vital Signs BP (!) 103/57   Pulse 70   Temp 98.7 F (37.1 C)   Resp 18   LMP 02/07/2018   SpO2 100%    Physical Exam  Constitutional: She is oriented to person, place, and time. She appears  well-developed and well-nourished. No distress.  HENT:  Head: Normocephalic and atraumatic.  Eyes: Pupils are equal, round, and reactive to light. Conjunctivae are normal.  Cardiovascular: Normal rate, regular rhythm and normal heart sounds. Exam reveals no gallop and no friction rub.  No murmur heard. Pulmonary/Chest: Effort normal and breath sounds normal. She has no wheezes. She has no rales.  Abdominal: Soft. Bowel sounds are normal. She exhibits no mass. There is no tenderness. There is no rebound, no guarding and no CVA tenderness.  Neurological: She is alert and oriented to person, place, and time.  Skin: Skin is warm and dry.  Psychiatric: She has a normal mood and affect. Her behavior is normal. Judgment normal.     UC Treatments / Results  Labs (all labs ordered are listed, but only abnormal results are displayed) Labs Reviewed  CERVICOVAGINAL ANCILLARY ONLY    EKG None Radiology No results found.  Procedures Procedures (including critical care time)  Medications Ordered in UC Medications - No data to display   Initial Impression / Assessment and Plan / UC Course  I have reviewed the triage vital signs and the nursing notes.  Pertinent labs &  imaging results that were available during my care of the patient were reviewed by me and considered in my medical decision making (see chart for details).    Patient was treated empirically for yeast.  Diflucan as directed.  Cytology sent, patient will be contacted with any positive results that require additional treatment. Patient to refrain from sexual activity for the next 7 days. Return precautions given.    Final Clinical Impressions(s) / UC Diagnoses   Final diagnoses:  Vaginal itching    ED Discharge Orders        Ordered    fluconazole (DIFLUCAN) 150 MG tablet  Daily     03/05/18 1928        Belinda Fisher, Cordelia Poche 03/05/18 1931

## 2018-03-05 NOTE — Discharge Instructions (Signed)
You were treated empirically for yeast, start diflucan as directed. Cytology sent, you will be contacted with any positive results that requires further treatment. Refrain from sexual activity and alcohol use for the next 7 days. Monitor for any worsening of symptoms, fever, abdominal pain, nausea, vomiting, to follow up for reevaluation.

## 2018-03-05 NOTE — ED Triage Notes (Signed)
Pt here for vaginal itching and irritation. She recently started a new soap.

## 2018-03-06 ENCOUNTER — Telehealth (HOSPITAL_COMMUNITY): Payer: Self-pay

## 2018-03-06 LAB — CERVICOVAGINAL ANCILLARY ONLY
BACTERIAL VAGINITIS: POSITIVE — AB
CANDIDA VAGINITIS: NEGATIVE
Chlamydia: NEGATIVE
Neisseria Gonorrhea: NEGATIVE
TRICH (WINDOWPATH): NEGATIVE

## 2018-03-06 NOTE — Telephone Encounter (Signed)
Bacterial vaginosis is positive. This was not treated at the urgent care visit.  No answer at this time. Voicemail left. Need to assess need for medication based on symptoms.

## 2018-03-12 ENCOUNTER — Telehealth (HOSPITAL_COMMUNITY): Payer: Self-pay

## 2018-03-12 NOTE — Telephone Encounter (Signed)
Attempted to reach patient x 2 to assess need for flagyl.

## 2018-06-12 ENCOUNTER — Inpatient Hospital Stay (HOSPITAL_COMMUNITY)
Admission: AD | Admit: 2018-06-12 | Discharge: 2018-06-12 | Disposition: A | Discharge status: Home / Self Care | Payer: Medicaid Other | Source: Ambulatory Visit | Attending: Obstetrics and Gynecology | Admitting: Obstetrics and Gynecology

## 2018-06-12 ENCOUNTER — Encounter (HOSPITAL_COMMUNITY): Payer: Self-pay

## 2018-06-12 DIAGNOSIS — B9689 Other specified bacterial agents as the cause of diseases classified elsewhere: Secondary | ICD-10-CM | POA: Insufficient documentation

## 2018-06-12 DIAGNOSIS — N760 Acute vaginitis: Secondary | ICD-10-CM | POA: Insufficient documentation

## 2018-06-12 LAB — URINALYSIS, ROUTINE W REFLEX MICROSCOPIC
Bilirubin Urine: NEGATIVE
Glucose, UA: NEGATIVE mg/dL
Hgb urine dipstick: NEGATIVE
KETONES UR: NEGATIVE mg/dL
Leukocytes, UA: NEGATIVE
Nitrite: NEGATIVE
Protein, ur: NEGATIVE mg/dL
Specific Gravity, Urine: 1.03 (ref 1.005–1.030)
pH: 6 (ref 5.0–8.0)

## 2018-06-12 LAB — WET PREP, GENITAL
SPERM: NONE SEEN
TRICH WET PREP: NONE SEEN
YEAST WET PREP: NONE SEEN

## 2018-06-12 LAB — POCT PREGNANCY, URINE: Preg Test, Ur: NEGATIVE

## 2018-06-12 MED ORDER — METRONIDAZOLE 500 MG PO TABS: 500.0000 mg | ORAL_TABLET | Freq: Two times a day (BID) | ORAL | 0 refills | Status: DC

## 2018-06-12 NOTE — Discharge Instructions (Signed)
Bacterial Vaginosis Bacterial vaginosis is a vaginal infection that occurs when the normal balance of bacteria in the vagina is disrupted. It results from an overgrowth of certain bacteria. This is the most common vaginal infection among women ages 15-44. Because bacterial vaginosis increases your risk for STIs (sexually transmitted infections), getting treated can help reduce your risk for chlamydia, gonorrhea, herpes, and HIV (human immunodeficiency virus). Treatment is also important for preventing complications in pregnant women, because this condition can cause an early (premature) delivery. What are the causes? This condition is caused by an increase in harmful bacteria that are normally present in small amounts in the vagina. However, the reason that the condition develops is not fully understood. What increases the risk? The following factors may make you more likely to develop this condition:  Having a new sexual partner or multiple sexual partners.  Having unprotected sex.  Douching.  Having an intrauterine device (IUD).  Smoking.  Drug and alcohol abuse.  Taking certain antibiotic medicines.  Being pregnant.  You cannot get bacterial vaginosis from toilet seats, bedding, swimming pools, or contact with objects around you. What are the signs or symptoms? Symptoms of this condition include:  Grey or white vaginal discharge. The discharge can also be watery or foamy.  A fish-like odor with discharge, especially after sexual intercourse or during menstruation.  Itching in and around the vagina.  Burning or pain with urination.  Some women with bacterial vaginosis have no signs or symptoms. How is this diagnosed? This condition is diagnosed based on:  Your medical history.  A physical exam of the vagina.  Testing a sample of vaginal fluid under a microscope to look for a large amount of bad bacteria or abnormal cells. Your health care provider may use a cotton swab  or a small wooden spatula to collect the sample.  How is this treated? This condition is treated with antibiotics. These may be given as a pill, a vaginal cream, or a medicine that is put into the vagina (suppository). If the condition comes back after treatment, a second round of antibiotics may be needed. Follow these instructions at home: Medicines  Take over-the-counter and prescription medicines only as told by your health care provider.  Take or use your antibiotic as told by your health care provider. Do not stop taking or using the antibiotic even if you start to feel better. General instructions  If you have a female sexual partner, tell her that you have a vaginal infection. She should see her health care provider and be treated if she has symptoms. If you have a female sexual partner, he does not need treatment.  During treatment: ? Avoid sexual activity until you finish treatment. ? Do not douche. ? Avoid alcohol as directed by your health care provider. ? Avoid breastfeeding as directed by your health care provider.  Drink enough water and fluids to keep your urine clear or pale yellow.  Keep the area around your vagina and rectum clean. ? Wash the area daily with warm water. ? Wipe yourself from front to back after using the toilet.  Keep all follow-up visits as told by your health care provider. This is important. How is this prevented?  Do not douche.  Wash the outside of your vagina with warm water only.  Use protection when having sex. This includes latex condoms and dental dams.  Limit how many sexual partners you have. To help prevent bacterial vaginosis, it is best to have sex with just   one partner (monogamous).  Make sure you and your sexual partner are tested for STIs.  Wear cotton or cotton-lined underwear.  Avoid wearing tight pants and pantyhose, especially during summer.  Limit the amount of alcohol that you drink.  Do not use any products that  contain nicotine or tobacco, such as cigarettes and e-cigarettes. If you need help quitting, ask your health care provider.  Do not use illegal drugs. Where to find more information:  Centers for Disease Control and Prevention: www.cdc.gov/std  American Sexual Health Association (ASHA): www.ashastd.org  [place]. Department of Health and Human Services, Office on Women's Health: www.womenshealth.gov/ or https://www.womenshealth.gov/a-z-topics/bacterial-vaginosis Contact a health care provider if:  Your symptoms do not improve, even after treatment.  You have more discharge or pain when urinating.  You have a fever.  You have pain in your abdomen.  You have pain during sex.  You have vaginal bleeding between periods. Summary  Bacterial vaginosis is a vaginal infection that occurs when the normal balance of bacteria in the vagina is disrupted.  Because bacterial vaginosis increases your risk for STIs (sexually transmitted infections), getting treated can help reduce your risk for chlamydia, gonorrhea, herpes, and HIV (human immunodeficiency virus). Treatment is also important for preventing complications in pregnant women, because the condition can cause an early (premature) delivery.  This condition is treated with antibiotic medicines. These may be given as a pill, a vaginal cream, or a medicine that is put into the vagina (suppository). This information is not intended to replace advice given to you by your health care provider. Make sure you discuss any questions you have with your health care provider. Document Released: 11/05/2005 Document Revised: 03/11/2017 Document Reviewed: 07/21/2016 Elsevier Interactive Patient Education  2018 [employer]  

## 2018-06-12 NOTE — MAU Note (Signed)
Reports tingling when urinating. Some increased frequency, no urgency  No vag discharge

## 2018-06-12 NOTE — MAU Provider Note (Signed)
History     CSN: [id number]  Arrival date and time: 06/12/18 1008   First Provider Initiated Contact with Patient 06/12/18 1043      Chief Complaint  Patient presents with  . Urinary Frequency   HPI  Melanie Andrews is a 22 year old [id number] who presents to the MAU with a chief complaint of urinary frequency. She states it started about 3 days ago and she has noticed she has to urinate more frequently and that it is burning. LMP 05/31/18 and condoms for contraception. She currently has 1 sexual partner denies any new ones. Patient desires GC/Chlamydia testing Of note, patient has a history of trichomonas that was treated.   OB History    Gravida  0   Para  0   Term  0   Preterm  0   AB  0   Living  0     SAB  0   TAB  0   Ectopic  0   Multiple  0   Live Births  0           History reviewed. No pertinent past medical history.  Past Surgical History:  Procedure Laterality Date  . TONSILLECTOMY      Family History  Problem Relation Age of Onset  . Hypertension Mother     Social History   Tobacco Use  . Smoking status: Never Smoker  . Smokeless tobacco: Never Used  Substance Use Topics  . Alcohol use: No  . Drug use: Yes    Frequency: 5.0 times per week    Types: Marijuana    Allergies:  Allergies  Allergen Reactions  . Latex Itching and Rash    Medications Prior to Admission  Medication Sig Dispense Refill Last Dose  . fluconazole (DIFLUCAN) 150 MG tablet Take 1 tablet (150 mg total) by mouth daily. Take second dose 72 hours later if symptoms still persists. 2 tablet 0   . ibuprofen (ADVIL,MOTRIN) 200 MG tablet Take 400 mg by mouth 2 (two) times daily as needed for moderate pain.   Past Week at Unknown time    Review of Systems  Constitutional: Negative.   HENT: Negative.   Respiratory: Negative.   Cardiovascular: Negative.   Gastrointestinal: Negative.   Genitourinary: Positive for frequency.       Burning with urination   Neurological: Negative.    Physical Exam   Blood pressure 97/67, pulse 95, temperature 98.4 F (36.9 C), temperature source Oral, resp. rate 18, height 5\' 1"  (1.549 m), weight 50.3 kg (110 lb 12.8 oz), last menstrual period 05/31/2018.  Physical Exam  Constitutional: She appears well-developed and well-nourished.  HENT:  Head: Normocephalic and atraumatic.  Cardiovascular: Normal rate and regular rhythm.  Respiratory: Effort normal and breath sounds normal.  GI: Soft. Bowel sounds are normal. There is no tenderness.  Skin: Skin is warm and dry.    MAU Course  Procedures UA GC/Chlamydia Wet Prep Results for orders placed or performed during the hospital encounter of 06/12/18 (from the past 24 hour(s))  Urinalysis, Routine w reflex microscopic     Status: None   Collection Time: 06/12/18 10:22 AM  Result Value Ref Range   Color, Urine YELLOW YELLOW   APPearance CLEAR CLEAR   Specific Gravity, Urine 1.030 1.005 - 1.030   pH 6.0 5.0 - 8.0   Glucose, UA NEGATIVE NEGATIVE mg/dL   Hgb urine dipstick NEGATIVE NEGATIVE   Bilirubin Urine NEGATIVE NEGATIVE   Ketones, ur NEGATIVE NEGATIVE  mg/dL   Protein, ur NEGATIVE NEGATIVE mg/dL   Nitrite NEGATIVE NEGATIVE   Leukocytes, UA NEGATIVE NEGATIVE  Pregnancy, urine POC     Status: None   Collection Time: 06/12/18 10:27 AM  Result Value Ref Range   Preg Test, Ur NEGATIVE NEGATIVE  Wet prep, genital     Status: Abnormal   Collection Time: 06/12/18 11:44 AM  Result Value Ref Range   Yeast Wet Prep HPF POC NONE SEEN NONE SEEN   Trich, Wet Prep NONE SEEN NONE SEEN   Clue Cells Wet Prep HPF POC PRESENT (A) NONE SEEN   WBC, Wet Prep HPF POC FEW (A) NONE SEEN   Sperm NONE SEEN      Assessment and Plan  Bacterial Vaginosis  Discharge to home Patient will start Flagyl Patient advised to return to MAU if condition worsens or changes  Melanie Andrews 06/12/2018, 11:43 AM

## 2018-06-12 NOTE — MAU Provider Note (Signed)
History     CSN: [id number]  Arrival date and time: 06/12/18 1008   First Provider Initiated Contact with Patient 06/12/18 1043      Chief Complaint  Patient presents with  . Urinary Frequency   Melanie Andrews is a 22 y.o. nulligravida presenting with concern for UTI.  She states that for the last couple of days she has had a" tingling" sensation at urethra during urination as well as some urinary frequency.  She has had 2 or 3 UTIs in the past.  She also states that the symptoms began after she used a new type of body soap.  No previous similar episodes.  Denies vaginal discharge but has some irritation.  LMP 05/31/2018.  Condoms only for contraception.  No new sex partner but does desire GC chlamydia testing.  Denies dysuria, urgency, hematuria, abdominal pain, back pain.        History reviewed. No pertinent past medical history.  Past Surgical History:  Procedure Laterality Date  . TONSILLECTOMY      Family History  Problem Relation Age of Onset  . Hypertension Mother     Social History   Tobacco Use  . Smoking status: Never Smoker  . Smokeless tobacco: Never Used  Substance Use Topics  . Alcohol use: No  . Drug use: Yes    Frequency: 5.0 times per week    Types: Marijuana    Allergies:  Allergies  Allergen Reactions  . Latex Itching and Rash    Medications Prior to Admission  Medication Sig Dispense Refill Last Dose  . fluconazole (DIFLUCAN) 150 MG tablet Take 1 tablet (150 mg total) by mouth daily. Take second dose 72 hours later if symptoms still persists. 2 tablet 0   . ibuprofen (ADVIL,MOTRIN) 200 MG tablet Take 400 mg by mouth 2 (two) times daily as needed for moderate pain.   Past Week at Unknown time    Review of Systems  Constitutional: Negative for chills and fever.  Gastrointestinal: Negative for abdominal pain.  Genitourinary: Positive for frequency. Negative for dyspareunia, dysuria, flank pain, hematuria, menstrual problem, urgency,  vaginal discharge and vaginal pain.  Musculoskeletal: Negative for back pain.   Physical Exam   Blood pressure 97/67, pulse 95, temperature 98.4 F (36.9 C), temperature source Oral, resp. rate 18, height 5\' 1"  (1.549 m), weight 110 lb 12.8 oz (50.3 kg), last menstrual period 05/31/2018.  Physical Exam  Nursing note and vitals reviewed. Constitutional: She is oriented to person, place, and time. She appears well-developed and well-nourished. No distress.  HENT:  Head: Normocephalic.  Eyes: Left eye exhibits discharge.  Neck: Neck supple.  Cardiovascular: Normal rate.  Respiratory: Effort normal.  GI: Soft. There is no tenderness. There is no guarding.  Genitourinary:  Genitourinary Comments: [facility] RN obtained culture specimens. Small amount homogenous discharge with slight malodor  Neg CVAT  Musculoskeletal: Normal range of motion.  Neurological: She is alert and oriented to person, place, and time.  Skin: Skin is warm and dry.    MAU Course  Procedures Results for orders placed or performed during the hospital encounter of 06/12/18 (from the past 24 hour(s))  Urinalysis, Routine w reflex microscopic     Status: None   Collection Time: 06/12/18 10:22 AM  Result Value Ref Range   Color, Urine YELLOW YELLOW   APPearance CLEAR CLEAR   Specific Gravity, Urine 1.030 1.005 - 1.030   pH 6.0 5.0 - 8.0   Glucose, UA NEGATIVE NEGATIVE mg/dL   Hgb  urine dipstick NEGATIVE NEGATIVE   Bilirubin Urine NEGATIVE NEGATIVE   Ketones, ur NEGATIVE NEGATIVE mg/dL   Protein, ur NEGATIVE NEGATIVE mg/dL   Nitrite NEGATIVE NEGATIVE   Leukocytes, UA NEGATIVE NEGATIVE  Pregnancy, urine POC     Status: None   Collection Time: 06/12/18 10:27 AM  Result Value Ref Range   Preg Test, Ur NEGATIVE NEGATIVE  Wet prep, genital     Status: Abnormal   Collection Time: 06/12/18 11:44 AM  Result Value Ref Range   Yeast Wet Prep HPF POC NONE SEEN NONE SEEN   Trich, Wet Prep NONE SEEN NONE SEEN   Clue  Cells Wet Prep HPF POC PRESENT (A) NONE SEEN   WBC, Wet Prep HPF POC FEW (A) NONE SEEN   Sperm NONE SEEN    GC/CT sent  Contraceptive counseling done, including options and relative  ineffectiveness of condoms.  Assessment and Plan   1. BV (bacterial vaginosis)    Allergies as of 06/12/2018      Reactions   Latex Itching, Rash      Medication List    STOP taking these medications   fluconazole 150 MG tablet Commonly known as:  DIFLUCAN     TAKE these medications   ibuprofen 200 MG tablet Commonly known as:  ADVIL,MOTRIN Take 400 mg by mouth 2 (two) times daily as needed for moderate pain.   metroNIDAZOLE 500 MG tablet Commonly known as:  FLAGYL Take 1 tablet (500 mg total) by mouth 2 (two) times daily.      Follow-up Information    Department, [facility] Follow up.   Contact information: [address] [place] [address] [phone]           Josephyne Tarter CNM 06/12/2018, 12:09 PM

## 2018-06-13 LAB — GC/CHLAMYDIA PROBE AMP (~~LOC~~) NOT AT ARMC
CHLAMYDIA, DNA PROBE: NEGATIVE
NEISSERIA GONORRHEA: NEGATIVE

## 2018-07-17 ENCOUNTER — Inpatient Hospital Stay (HOSPITAL_COMMUNITY)
Admission: AD | Admit: 2018-07-17 | Discharge: 2018-07-17 | Disposition: A | Discharge status: Home / Self Care | Payer: Self-pay | Source: Ambulatory Visit | Attending: Obstetrics and Gynecology | Admitting: Obstetrics and Gynecology

## 2018-07-17 DIAGNOSIS — H6691 Otitis media, unspecified, right ear: Secondary | ICD-10-CM | POA: Insufficient documentation

## 2018-07-17 DIAGNOSIS — B9689 Other specified bacterial agents as the cause of diseases classified elsewhere: Secondary | ICD-10-CM | POA: Insufficient documentation

## 2018-07-17 DIAGNOSIS — R05 Cough: Secondary | ICD-10-CM | POA: Insufficient documentation

## 2018-07-17 DIAGNOSIS — Z79899 Other long term (current) drug therapy: Secondary | ICD-10-CM | POA: Insufficient documentation

## 2018-07-17 DIAGNOSIS — R059 Cough, unspecified: Secondary | ICD-10-CM

## 2018-07-17 DIAGNOSIS — J019 Acute sinusitis, unspecified: Secondary | ICD-10-CM | POA: Insufficient documentation

## 2018-07-17 DIAGNOSIS — Z791 Long term (current) use of non-steroidal anti-inflammatories (NSAID): Secondary | ICD-10-CM | POA: Insufficient documentation

## 2018-07-17 MED ORDER — BENZONATATE 100 MG PO CAPS: 200.0000 mg | ORAL_CAPSULE | Freq: Three times a day (TID) | ORAL | 0 refills | Status: DC

## 2018-07-17 MED ORDER — AMOXICILLIN-POT CLAVULANATE 875-125 MG PO TABS: 1.0000 | ORAL_TABLET | Freq: Two times a day (BID) | ORAL | 0 refills | Status: DC

## 2018-07-17 NOTE — MAU Provider Note (Signed)
History     CSN: [id number]  Arrival date and time: 07/17/18 1648   First Provider Initiated Contact with Patient 07/17/18 1712      Chief Complaint  Patient presents with  . Nasal Congestion  . Otalgia   HPI  Ms.Melanie Andrews is 22 y.o. non pregnant female here in MAU with right ear pain, fever and congestion.  She attests to a fever of 100.0, cough, chest tightness with cough, ear pain and scratchy throat for 1 week. Does not have a PCP. She has tried dayquil, nightquil, and theraflu and ibuprofen; helping some however she feels she should be feeling better by now. Her right ear worse than left ear. Her ear pain waxes and wanes. The pain is throbbing.  + dcrease in appetite.   OB History    Gravida  0   Para  0   Term  0   Preterm  0   AB  0   Living  0     SAB  0   TAB  0   Ectopic  0   Multiple  0   Live Births  0           No past medical history on file.  Past Surgical History:  Procedure Laterality Date  . TONSILLECTOMY      Family History  Problem Relation Age of Onset  . Hypertension Mother     Social History   Tobacco Use  . Smoking status: Never Smoker  . Smokeless tobacco: Never Used  Substance Use Topics  . Alcohol use: No  . Drug use: Yes    Frequency: 5.0 times per week    Types: Marijuana    Allergies:  Allergies  Allergen Reactions  . Latex Itching and Rash    Medications Prior to Admission  Medication Sig Dispense Refill Last Dose  . ibuprofen (ADVIL,MOTRIN) 200 MG tablet Take 400 mg by mouth 2 (two) times daily as needed for moderate pain.   Past Week at Unknown time  . metroNIDAZOLE (FLAGYL) 500 MG tablet Take 1 tablet (500 mg total) by mouth 2 (two) times daily. 14 tablet 0    No results found for this or any previous visit (from the past 48 hour(s)).  Review of Systems  Constitutional: Positive for fever.  HENT: Positive for congestion and sinus pressure.   Respiratory: Negative for shortness of breath.    Cardiovascular: Negative for chest pain.  Gastrointestinal: Negative for abdominal pain.   Physical Exam   Blood pressure 104/60, pulse 98, temperature 99.1 F (37.3 C), temperature source Oral, resp. rate 16, height 5\' 2"  (1.575 m), weight 50.8 kg, last menstrual period 06/19/2018, SpO2 100 %.  Physical Exam  Constitutional: She is oriented to person, place, and time. She appears well-developed and well-nourished.  Non-toxic appearance. She has a sickly appearance. No distress.  HENT:  Right Ear: There is tenderness. Tympanic membrane is erythematous and bulging.  Left Ear: Hearing, tympanic membrane and ear canal normal.  Nose: Right sinus exhibits maxillary sinus tenderness and frontal sinus tenderness. Left sinus exhibits maxillary sinus tenderness and frontal sinus tenderness.  Mouth/Throat: Mucous membranes are pale and dry. Posterior oropharyngeal erythema present. No oropharyngeal exudate, posterior oropharyngeal edema or tonsillar abscesses.  Eyes: Lids are normal.  Neck: Normal range of motion. Neck supple.  Neurological: She is alert and oriented to person, place, and time.  Skin: Skin is warm. She is not diaphoretic.  Psychiatric: Her behavior is normal.   MAU  Course  Procedures  None  MDM  Assessment and Plan   A:  1. Acute otitis media, right   2. Acute non-recurrent sinusitis, unspecified location   3. Cough     P:  Discharge home in stable condition Rx: Augmentin, Tessalon pearles Cool mist humidifier Increase oral fluids Increase rest  Rasch, Harolyn RutherfordJennifer I, NP 07/17/2018 7:30 PM

## 2018-07-17 NOTE — Discharge Instructions (Signed)
Cool Mist Vaporizer A cool mist vaporizer is a device that releases a cool mist into the air. If you have a cough or a cold, using a vaporizer may help relieve your symptoms. The mist adds moisture to the air, which may help thin your mucus and make it less sticky. When your mucus is thin and less sticky, it easier for you to breathe and to cough up secretions. Do not use a vaporizer if you are allergic to mold. Follow these instructions at home:  Follow the instructions that come with the vaporizer.  Do not use anything other than distilled water in the vaporizer.  Do not run the vaporizer all of the time. Doing that can cause mold or bacteria to grow in the vaporizer.  Clean the vaporizer after each time that you use it.  Clean and dry the vaporizer well before storing it.  Stop using the vaporizer if your breathing symptoms get worse. This information is not intended to replace advice given to you by your health care provider. Make sure you discuss any questions you have with your health care provider. Document Released: 08/02/2004 Document Revised: 05/25/2016 Document Reviewed: 02/04/2016 Elsevier Interactive Patient Education  2018 [employer]   Cough, Adult Coughing is a reflex that clears your throat and your airways. Coughing helps to heal and protect your lungs. It is normal to cough occasionally, but a cough that happens with other symptoms or lasts a long time may be a sign of a condition that needs treatment. A cough may last only 2-3 weeks (acute), or it may last longer than 8 weeks (chronic). What are the causes? Coughing is commonly caused by:  Breathing in substances that irritate your lungs.  A viral or bacterial respiratory infection.  Allergies.  Asthma.  Postnasal drip.  Smoking.  Acid backing up from the stomach into the esophagus (gastroesophageal reflux).  Certain medicines.  Chronic lung problems, including COPD (or rarely, lung cancer).  Other  medical conditions such as heart failure.  Follow these instructions at home: Pay attention to any changes in your symptoms. Take these actions to help with your discomfort:  Take medicines only as told by your health care provider. ? If you were prescribed an antibiotic medicine, take it as told by your health care provider. Do not stop taking the antibiotic even if you start to feel better. ? Talk with your health care provider before you take a cough suppressant medicine.  Drink enough fluid to keep your urine clear or pale yellow.  If the air is dry, use a cold steam vaporizer or humidifier in your bedroom or your home to help loosen secretions.  Avoid anything that causes you to cough at work or at home.  If your cough is worse at night, try sleeping in a semi-upright position.  Avoid cigarette smoke. If you smoke, quit smoking. If you need help quitting, ask your health care provider.  Avoid caffeine.  Avoid alcohol.  Rest as needed.  Contact a health care provider if:  You have new symptoms.  You cough up pus.  Your cough does not get better after 2-3 weeks, or your cough gets worse.  You cannot control your cough with suppressant medicines and you are losing sleep.  You develop pain that is getting worse or pain that is not controlled with pain medicines.  You have a fever.  You have unexplained weight loss.  You have night sweats. Get help right away if:  You cough up  blood.  You have difficulty breathing.  Your heartbeat is very fast. This information is not intended to replace advice given to you by your health care provider. Make sure you discuss any questions you have with your health care provider. Document Released: 05/04/2011 Document Revised: 04/12/2016 Document Reviewed: 01/12/2015 Elsevier Interactive Patient Education  [employer].

## 2018-07-28 ENCOUNTER — Ambulatory Visit (HOSPITAL_COMMUNITY)
Admission: AD | Admit: 2018-07-28 | Discharge: 2018-07-28 | Disposition: A | Discharge status: Home / Self Care | Payer: Self-pay | Attending: Psychiatry | Admitting: Psychiatry

## 2018-07-28 NOTE — H&P (Signed)
Behavioral Health Medical Screening Exam  Melanie Andrews is an 22 y.o. female who presents as a walk in due to "My parents got concerned because I had a panic attack. I would like to start with a therapist or maybe see someone for medications." Patient denies any SI/HI. She received information for outpatient mental health services. No criteria met for inpatient psychiatric treatments.   Total Time spent with patient: 20 minutes  Psychiatric Specialty Exam: Physical Exam  Constitutional: She is oriented to person, place, and time. She appears well-developed and well-nourished.  HENT:  Head: Normocephalic.  Neck: Normal range of motion.  Cardiovascular: Normal rate, regular rhythm, normal heart sounds and intact distal pulses.  Respiratory: Effort normal and breath sounds normal.  GI: Soft. Bowel sounds are normal.  Musculoskeletal: Normal range of motion.  Neurological: She is alert and oriented to person, place, and time.  Skin: Skin is warm.    ROS  Blood pressure 100/69, pulse 75, temperature 98.3 F (36.8 C), resp. rate 18, SpO2 100 %.There is no height or weight on file to calculate BMI.  General Appearance: Casual  Eye Contact:  Good  Speech:  Clear and Coherent  Volume:  Normal  Mood:  Anxious  Affect:  Appropriate  Thought Process:  Coherent and Goal Directed  Orientation:  Full (Time, Place, and Person)  Thought Content:  WDL  Suicidal Thoughts:  No  Homicidal Thoughts:  No  Memory:  Immediate;   Good Recent;   Good Remote;   Good  Judgement:  Fair  Insight:  Present  Psychomotor Activity:  Normal  Concentration: Concentration: Good and Attention Span: Good  Recall:  Good  Fund of Knowledge:Good  Language: Good  Akathisia:  No  Handed:  Right  AIMS (if indicated):     Assets:  Communication Skills Desire for Improvement Financial Resources/Insurance Housing Intimacy Leisure Time Physical Health Resilience Social Support  Sleep:        Musculoskeletal: Strength & Muscle Tone: within normal limits Gait & Station: normal Patient leans: N/A  Blood pressure 100/69, pulse 75, temperature 98.3 F (36.8 C), resp. rate 18, SpO2 100 %.  Recommendations:  Based on my evaluation the patient does not appear to have an emergency medical condition.  Elmarie Shiley, NP 07/28/2018, 2:12 PM

## 2018-07-28 NOTE — BH Assessment (Signed)
Assessment Note  Melanie Andrews is a 22 y.o. female in [facility] as a walk in, due to anxiety. Pt denies SI, HI, AVH. Pt reports she came in due to her parent's insistence. She reports that they feel like she has panic attacks over the littlest things-that small situations trigger her.   Case staffed with Fransisca Kaufmann, PMHNP, who also spoke with pt. Pt does not meet criteria for IP treatment and has been given resources for OP therapy and psychiatry.   Diagnosis: Generalized Anxiety d/o  Past Medical History: No past medical history on file.  Past Surgical History:  Procedure Laterality Date  . TONSILLECTOMY      Family History:  Family History  Problem Relation Age of Onset  . Hypertension Mother     Social History:  reports that she has never smoked. She has never used smokeless tobacco. She reports that she has current or past drug history. Drug: Marijuana. Frequency: 5.00 times per week. She reports that she does not drink alcohol.  Additional Social History:  Alcohol / Drug Use Pain Medications: pt denies Prescriptions: pt denies Over the Counter: pt denies History of alcohol / drug use?: Yes Substance #1 Name of Substance 1: marijuana 1 - Frequency: daily 1 - Duration: ongoing 1 - Last Use / Amount: yesterday  CIWA: CIWA-Ar BP: 100/69 Pulse Rate: 75 COWS:    Allergies:  Allergies  Allergen Reactions  . Latex Itching and Rash    Home Medications:  (Not in a hospital admission)  OB/GYN Status:  No LMP recorded.  General Assessment Data Location of Assessment: [facility] Assessment Services TTS Assessment: In system Is this a Tele or Face-to-Face Assessment?: Face-to-Face Is this an Initial Assessment or a Re-assessment for this encounter?: Initial Assessment Patient Accompanied by:: Parent Language Other than English: No Living Arrangements: Other (Comment) What gender do you identify as?: Female Marital status: Single Pregnancy Status: No Living Arrangements:  Other relatives Can pt return to current living arrangement?: Yes Admission Status: Voluntary Is patient capable of signing voluntary admission?: Yes Referral Source: Self/Family/Friend  Medical Screening Exam [facility] Walk-in ONLY) Medical Exam completed: Yes  Crisis Care Plan Living Arrangements: Other relatives Name of Psychiatrist: none Name of Therapist: none  Education Status Is patient currently in school?: Yes Current Grade: vocational school Name of school: [employer] Is the patient employed, unemployed or receiving disability?: Unemployed  Risk to self with the past 6 months Suicidal Ideation: No Has patient been a risk to self within the past 6 months prior to admission? : No Suicidal Intent: No Has patient had any suicidal intent within the past 6 months prior to admission? : No Is patient at risk for suicide?: No Suicidal Plan?: No Has patient had any suicidal plan within the past 6 months prior to admission? : No Access to Means: No Previous Attempts/Gestures: No Intentional Self Injurious Behavior: None Family Suicide History: No Persecutory voices/beliefs?: No Depression: No Substance abuse history and/or treatment for substance abuse?: No Suicide prevention information given to non-admitted patients: Not applicable  Risk to Others within the past 6 months Homicidal Ideation: No Does patient have any lifetime risk of violence toward others beyond the six months prior to admission? : No Thoughts of Harm to Others: No Current Homicidal Intent: No Current Homicidal Plan: No Access to Homicidal Means: No History of harm to others?: No Assessment of Violence: None Noted Does patient have access to weapons?: No Criminal Charges Pending?: Yes Describe Pending Criminal Charges:  misdemeanor larceny; possess marijuana; possess drug paraphernalia Does patient have a court date: Yes Court Date: 07/31/18 Is patient on probation?:  Unknown  Psychosis Hallucinations: None noted Delusions: None noted  Mental Status Report Appearance/Hygiene: Unremarkable Eye Contact: Good Motor Activity: Unremarkable Speech: Unremarkable Level of Consciousness: Alert Mood: Pleasant, Euthymic Affect: Appropriate to circumstance Anxiety Level: Minimal Thought Processes: Coherent, Relevant Judgement: Unimpaired Orientation: Person, Place, Time, Situation Obsessive Compulsive Thoughts/Behaviors: None  Cognitive Functioning Concentration: Normal Memory: Recent Intact, Remote Intact Is patient IDD: No Insight: Good Impulse Control: Good Appetite: Fair Have you had any weight changes? : No Change Sleep: Decreased Total Hours of Sleep: 3 Vegetative Symptoms: None  ADLScreening [facility] Assessment Services) Patient's cognitive ability adequate to safely complete daily activities?: Yes Patient able to express need for assistance with ADLs?: Yes Independently performs ADLs?: Yes (appropriate for developmental age)  Prior Inpatient Therapy Prior Inpatient Therapy: No  Prior Outpatient Therapy Prior Outpatient Therapy: Yes Prior Therapy Dates: early 2019 for a week Prior Therapy Facilty/Provider(s): unknown Reason for Treatment: depression Does patient have an ACCT team?: No Does patient have Intensive In-House Services?  : No Does patient have [facility] services? : No Does patient have P4CC services?: No  ADL Screening (condition at time of admission) Patient's cognitive ability adequate to safely complete daily activities?: Yes Is the patient deaf or have difficulty hearing?: No Does the patient have difficulty seeing, even when wearing glasses/contacts?: No Does the patient have difficulty concentrating, remembering, or making decisions?: No Patient able to express need for assistance with ADLs?: Yes Does the patient have difficulty dressing or bathing?: No Independently performs ADLs?: Yes (appropriate for developmental  age) Does the patient have difficulty walking or climbing stairs?: No Weakness of Legs: None Weakness of Arms/Hands: None  Home Assistive Devices/Equipment Home Assistive Devices/Equipment: None    Abuse/Neglect Assessment (Assessment to be complete while patient is alone) Abuse/Neglect Assessment Can Be Completed: Yes Physical Abuse: Denies Verbal Abuse: Denies Sexual Abuse: Denies Exploitation of patient/patient's resources: Denies Self-Neglect: Denies     Merchant navy officer (For Healthcare) Does Patient Have a Medical Advance Directive?: No          Disposition:  Disposition Initial Assessment Completed for this Encounter: Yes Disposition of Patient: Discharge Patient refused recommended treatment: No Mode of transportation if patient is discharged?: Car Patient referred to: Outpatient clinic referral  On Site Evaluation by:   Reviewed with Physician:    Laddie Aquas 07/28/2018 2:21 PM

## 2018-10-15 ENCOUNTER — Encounter (HOSPITAL_COMMUNITY): Payer: Self-pay

## 2018-10-15 ENCOUNTER — Emergency Department (HOSPITAL_COMMUNITY)
Admission: EM | Admit: 2018-10-15 | Discharge: 2018-10-15 | Disposition: A | Discharge status: Home / Self Care | Payer: Self-pay | Attending: Emergency Medicine | Admitting: Emergency Medicine

## 2018-10-15 DIAGNOSIS — H6691 Otitis media, unspecified, right ear: Secondary | ICD-10-CM | POA: Insufficient documentation

## 2018-10-15 DIAGNOSIS — Z91040 Latex allergy status: Secondary | ICD-10-CM | POA: Insufficient documentation

## 2018-10-15 MED ORDER — AMOXICILLIN-POT CLAVULANATE 875-125 MG PO TABS: 1.0000 | ORAL_TABLET | Freq: Two times a day (BID) | ORAL | 0 refills | Status: DC

## 2018-10-15 NOTE — Discharge Instructions (Addendum)
Return if any problems.

## 2018-10-15 NOTE — ED Provider Notes (Signed)
[facility] EMERGENCY DEPARTMENT Provider Note   CSN: [id number] Arrival date & time: 10/15/18  2025     History   Chief Complaint Chief Complaint  Patient presents with  . Otalgia    HPI Melanie Andrews is a 22 y.o. female.  The history is provided by the patient. No language interpreter was used.  Otalgia  This is a new problem. The current episode started more than 2 days ago. There is pain in the right ear. The problem has not changed since onset.There has been no fever. The pain is moderate. Pertinent negatives include no sore throat.  Pt complains of sinus congestion and ear pain. Pt was seen and given an antibiotic recently but she lost the medicine.   History reviewed. No pertinent past medical history.  Patient Active Problem List   Diagnosis Date Noted  . History of abdominal pain 10/22/2016  . Encounter for initial prescription of contraceptive pills 10/22/2016    Past Surgical History:  Procedure Laterality Date  . TONSILLECTOMY       OB History    Gravida  0   Para  0   Term  0   Preterm  0   AB  0   Living  0     SAB  0   TAB  0   Ectopic  0   Multiple  0   Live Births  0            Home Medications    Prior to Admission medications   Medication Sig Start Date End Date Taking? Authorizing Provider  amoxicillin-clavulanate (AUGMENTIN) 875-125 MG tablet Take 1 tablet by mouth every 12 (twelve) hours. 10/15/18   Elson Areas, PA-C  benzonatate (TESSALON) 100 MG capsule Take 2 capsules (200 mg total) by mouth every 8 (eight) hours. 07/17/18   Rasch, Victorino Dike I, NP  ibuprofen (ADVIL,MOTRIN) 200 MG tablet Take 400 mg by mouth 2 (two) times daily as needed for moderate pain.    [provider]  metroNIDAZOLE (FLAGYL) 500 MG tablet Take 1 tablet (500 mg total) by mouth 2 (two) times daily. 06/12/18   Poe, Deirdre Salena Saner, CNM    Family History Family History  Problem Relation Age of Onset  . Hypertension  Mother     Social History Social History   Tobacco Use  . Smoking status: Never Smoker  . Smokeless tobacco: Never Used  Substance Use Topics  . Alcohol use: No  . Drug use: Yes    Frequency: 5.0 times per week    Types: Marijuana     Allergies   Latex   Review of Systems Review of Systems  HENT: Positive for ear pain. Negative for sore throat.   All other systems reviewed and are negative.    Physical Exam Updated Vital Signs BP 107/67 (BP Location: Right Arm)   Pulse 83   Temp 99.1 F (37.3 C) (Oral)   Resp 16   LMP 10/09/2018   SpO2 100%   Physical Exam  Constitutional: She is oriented to person, place, and time. She appears well-developed and well-nourished.  HENT:  Head: Normocephalic.  Left Ear: External ear normal.  Nose: Nose normal.  Right tm dull, poor landmarks   Eyes: Pupils are equal, round, and reactive to light. EOM are normal.  Neck: Normal range of motion.  Pulmonary/Chest: Effort normal.  Abdominal: She exhibits no distension.  Musculoskeletal: Normal range of motion.  Neurological: She is alert and oriented to  person, place, and time.  Psychiatric: She has a normal mood and affect.  Nursing note and vitals reviewed.    ED Treatments / Results  Labs (all labs ordered are listed, but only abnormal results are displayed) Labs Reviewed - No data to display  EKG None  Radiology No results found.  Procedures Procedures (including critical care time)  Medications Ordered in ED Medications - No data to display   Initial Impression / Assessment and Plan / ED Course  I have reviewed the triage vital signs and the nursing notes.  Pertinent labs & imaging results that were available during my care of the patient were reviewed by me and considered in my medical decision making (see chart for details).      Final Clinical Impressions(s) / ED Diagnoses   Final diagnoses:  Right otitis media, unspecified otitis media type     ED Discharge Orders         Ordered    amoxicillin-clavulanate (AUGMENTIN) 875-125 MG tablet  Every 12 hours     10/15/18 2147        An After Visit Summary was printed and given to the patient.    Elson AreasSofia, Verlin Uher K, Cordelia Poche-C 10/15/18 2206    Cathren LaineSteinl, Kevin, MD 10/16/18 [phone]

## 2018-10-15 NOTE — ED Triage Notes (Signed)
Pt states that for the past two days she has been having R ear pain and now is hurting in the L. Complaining of sinus congestion

## 2019-10-27 ENCOUNTER — Ambulatory Visit: Payer: Self-pay

## 2020-01-04 ENCOUNTER — Ambulatory Visit
Admission: EM | Admit: 2020-01-04 | Discharge: 2020-01-04 | Disposition: A | Discharge status: Home / Self Care | Payer: Medicaid Other | Attending: Physician Assistant | Admitting: Physician Assistant

## 2020-01-04 DIAGNOSIS — N898 Other specified noninflammatory disorders of vagina: Secondary | ICD-10-CM | POA: Insufficient documentation

## 2020-01-04 DIAGNOSIS — Z3202 Encounter for pregnancy test, result negative: Secondary | ICD-10-CM

## 2020-01-04 LAB — POCT URINE PREGNANCY: Preg Test, Ur: NEGATIVE

## 2020-01-04 MED ORDER — FLUCONAZOLE 150 MG PO TABS: 150.0000 mg | ORAL_TABLET | Freq: Every day | ORAL | 0 refills | Status: DC

## 2020-01-04 MED ORDER — METRONIDAZOLE 500 MG PO TABS: 500.0000 mg | ORAL_TABLET | Freq: Two times a day (BID) | ORAL | 0 refills | Status: DC

## 2020-01-04 NOTE — ED Triage Notes (Signed)
Pt c/o vaginal discharge with slight odor x3 days. States had un protective intercourse x1wk

## 2020-01-04 NOTE — Discharge Instructions (Signed)
You were treated empirically for flagyl for BV. Diflucan if starting to have vaginal itching. Cytology sent, you will be contacted with any positive results that requires further treatment. Refrain from sexual activity and alcohol use for the next 7 days. Monitor for any worsening of symptoms, fever, abdominal pain, nausea, vomiting, to follow up for reevaluation.

## 2020-01-04 NOTE — ED Provider Notes (Signed)
[facility]    CSN: [id number] Arrival date & time: 01/04/20  1755      History   Chief Complaint Chief Complaint  Patient presents with  . SEXUALLY TRANSMITTED DISEASE    HPI Melanie Andrews is a 24 y.o. female.   24 year old female comes in for 3 day history of vaginal discharge, vaginal odor. Denies urinary frequency, dysuria, hematuria. Denies abdominal pain, nausea, vomiting. Denies fever, chills, flank/back pain. Denies itching, spotting. LMP 12/27/2019. Sexually active with one female partner, no condom use.      History reviewed. No pertinent past medical history.  Patient Active Problem List   Diagnosis Date Noted  . History of abdominal pain 10/22/2016  . Encounter for initial prescription of contraceptive pills 10/22/2016    Past Surgical History:  Procedure Laterality Date  . TONSILLECTOMY      OB History    Gravida  0   Para  0   Term  0   Preterm  0   AB  0   Living  0     SAB  0   TAB  0   Ectopic  0   Multiple  0   Live Births  0            Home Medications    Prior to Admission medications   Medication Sig Start Date End Date Taking? Authorizing Provider  fluconazole (DIFLUCAN) 150 MG tablet Take 1 tablet (150 mg total) by mouth daily. Take second dose 72 hours later if symptoms still persists. 01/04/20   Tasia Catchings, Lesle Faron V, PA-C  metroNIDAZOLE (FLAGYL) 500 MG tablet Take 1 tablet (500 mg total) by mouth 2 (two) times daily. 01/04/20   Ok Edwards, PA-C    Family History Family History  Problem Relation Age of Onset  . Hypertension Mother     Social History Social History   Tobacco Use  . Smoking status: Never Smoker  . Smokeless tobacco: Never Used  Substance Use Topics  . Alcohol use: No  . Drug use: Yes    Frequency: 5.0 times per week    Types: Marijuana     Allergies   Latex   Review of Systems Review of Systems  Reason unable to perform ROS: See HPI as above.     Physical Exam Triage Vital  Signs ED Triage Vitals  Enc Vitals Group     BP 01/04/20 1824 108/73     Pulse Rate 01/04/20 1824 87     Resp 01/04/20 1824 16     Temp 01/04/20 1824 98.2 F (36.8 C)     Temp Source 01/04/20 1824 Oral     SpO2 01/04/20 1824 99 %     Weight --      Height --      Head Circumference --      Peak Flow --      Pain Score 01/04/20 1833 0     Pain Loc --      Pain Edu? --      Excl. in [place]? --    No data found.  Updated Vital Signs BP 108/73 (BP Location: Left Arm)   Pulse 87   Temp 98.2 F (36.8 C) (Oral)   Resp 16   LMP 12/21/2019   SpO2 99%   Visual Acuity Right Eye Distance:   Left Eye Distance:   Bilateral Distance:    Right Eye Near:   Left Eye Near:    Bilateral Near:  Physical Exam Constitutional:      General: She is not in acute distress.    Appearance: She is well-developed. She is not ill-appearing, toxic-appearing or diaphoretic.  HENT:     Head: Normocephalic and atraumatic.  Eyes:     Conjunctiva/sclera: Conjunctivae normal.     Pupils: Pupils are equal, round, and reactive to light.  Cardiovascular:     Rate and Rhythm: Normal rate and regular rhythm.  Pulmonary:     Effort: Pulmonary effort is normal. No respiratory distress.     Comments: LCTAB Abdominal:     General: Bowel sounds are normal.     Palpations: Abdomen is soft.     Tenderness: There is no abdominal tenderness. There is no right CVA tenderness, left CVA tenderness, guarding or rebound.  Musculoskeletal:     Cervical back: Normal range of motion and neck supple.  Skin:    General: Skin is warm and dry.  Neurological:     Mental Status: She is alert and oriented to person, place, and time.  Psychiatric:        Behavior: Behavior normal.        Judgment: Judgment normal.      UC Treatments / Results  Labs (all labs ordered are listed, but only abnormal results are displayed) Labs Reviewed  POCT URINE PREGNANCY  CERVICOVAGINAL ANCILLARY ONLY     EKG   Radiology No results found.  Procedures Procedures (including critical care time)  Medications Ordered in UC Medications - No data to display  Initial Impression / Assessment and Plan / UC Course  I have reviewed the triage vital signs and the nursing notes.  Pertinent labs & imaging results that were available during my care of the patient were reviewed by me and considered in my medical decision making (see chart for details).    Patient was treated empirically for BV with flagyl. Diflucan if develop yeast like symptoms. Cytology sent, patient will be contacted with any positive results that require additional treatment. Patient to refrain from sexual activity for the next 7 days. Return precautions given.   Final Clinical Impressions(s) / UC Diagnoses   Final diagnoses:  Vaginal discharge    ED Prescriptions    Medication Sig Dispense Auth. Provider   metroNIDAZOLE (FLAGYL) 500 MG tablet Take 1 tablet (500 mg total) by mouth 2 (two) times daily. 14 tablet Wallice Granville V, PA-C   fluconazole (DIFLUCAN) 150 MG tablet Take 1 tablet (150 mg total) by mouth daily. Take second dose 72 hours later if symptoms still persists. 2 tablet Belinda Fisher, PA-C     PDMP not reviewed this encounter.   Belinda Fisher, PA-C 01/04/20 2204

## 2020-01-06 LAB — CERVICOVAGINAL ANCILLARY ONLY
Chlamydia: NEGATIVE
Neisseria Gonorrhea: NEGATIVE
Trichomonas: NEGATIVE

## 2020-01-11 ENCOUNTER — Ambulatory Visit: Payer: Medicaid Other

## 2020-04-06 ENCOUNTER — Ambulatory Visit: Payer: Self-pay | Admitting: Obstetrics

## 2020-04-21 ENCOUNTER — Encounter: Payer: Self-pay | Admitting: Obstetrics

## 2020-04-21 ENCOUNTER — Ambulatory Visit (INDEPENDENT_AMBULATORY_CARE_PROVIDER_SITE_OTHER): Payer: Medicaid Other | Admitting: Obstetrics

## 2020-04-21 ENCOUNTER — Other Ambulatory Visit: Payer: Self-pay

## 2020-04-21 ENCOUNTER — Other Ambulatory Visit (HOSPITAL_COMMUNITY)
Admission: RE | Admit: 2020-04-21 | Discharge: 2020-04-21 | Disposition: A | Discharge status: Home / Self Care | Payer: 59 | Source: Ambulatory Visit | Attending: Obstetrics | Admitting: Obstetrics

## 2020-04-21 VITALS — BP 103/72 | HR 81 | Ht 62.0 in | Wt 113.0 lb

## 2020-04-21 DIAGNOSIS — Z3009 Encounter for other general counseling and advice on contraception: Secondary | ICD-10-CM

## 2020-04-21 DIAGNOSIS — R238 Other skin changes: Secondary | ICD-10-CM

## 2020-04-21 DIAGNOSIS — N9412 Deep dyspareunia: Secondary | ICD-10-CM | POA: Diagnosis not present

## 2020-04-21 DIAGNOSIS — R102 Pelvic and perineal pain: Secondary | ICD-10-CM | POA: Diagnosis not present

## 2020-04-21 DIAGNOSIS — Z01419 Encounter for gynecological examination (general) (routine) without abnormal findings: Secondary | ICD-10-CM

## 2020-04-21 DIAGNOSIS — Z30011 Encounter for initial prescription of contraceptive pills: Secondary | ICD-10-CM

## 2020-04-21 DIAGNOSIS — Z3040 Encounter for surveillance of contraceptives, unspecified: Secondary | ICD-10-CM | POA: Diagnosis present

## 2020-04-21 DIAGNOSIS — Z1272 Encounter for screening for malignant neoplasm of vagina: Secondary | ICD-10-CM

## 2020-04-21 DIAGNOSIS — N898 Other specified noninflammatory disorders of vagina: Secondary | ICD-10-CM | POA: Diagnosis present

## 2020-04-21 DIAGNOSIS — Z113 Encounter for screening for infections with a predominantly sexual mode of transmission: Secondary | ICD-10-CM

## 2020-04-21 MED ORDER — LEVONORGESTREL-ETHINYL ESTRAD 0.15-30 MG-MCG PO TABS: 1.0000 | ORAL_TABLET | Freq: Every day | ORAL | 11 refills | Status: DC

## 2020-04-21 NOTE — Progress Notes (Signed)
Pt was treated for GC 3 weeks ago.  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   Pt will have pain with intercourse at times.  Pt denies bleeding with intercourse.

## 2020-04-21 NOTE — Progress Notes (Signed)
Subjective:        Melanie Andrews is a 24 y.o. female here for a routine exam.  Current complaints: Pelvic pain during intercourse.    Personal health questionnaire:  Is patient Ashkenazi Jewish, have a family history of breast and/or ovarian cancer: no Is there a family history of uterine cancer diagnosed at age < 63, gastrointestinal cancer, urinary tract cancer, family member who is a Personnel officer syndrome-associated carrier: no Is the patient overweight and hypertensive, family history of diabetes, personal history of gestational diabetes, preeclampsia or PCOS: no Is patient over 37, have PCOS,  family history of premature CHD under age 50, diabetes, smoke, have hypertension or peripheral artery disease:  no At any time, has a partner hit, kicked or otherwise hurt or frightened you?: no Over the past 2 weeks, have you felt down, depressed or hopeless?: no Over the past 2 weeks, have you felt little interest or pleasure in doing things?:no   Gynecologic History Patient's last menstrual period was 04/03/2020. Contraception: condoms Last Pap: unknown. Results were: unknown Last mammogram: n/a. Results were: n/a  Obstetric History OB History  Gravida Para Term Preterm AB Living  0 0 0 0 0 0  SAB TAB Ectopic Multiple Live Births  0 0 0 0 0    History reviewed. No pertinent past medical history.  Past Surgical History:  Procedure Laterality Date   TONSILLECTOMY       Current Outpatient Medications:    levonorgestrel-ethinyl estradiol (NORDETTE) 0.15-30 MG-MCG tablet, Take 1 tablet by mouth daily., Disp: 1 Package, Rfl: 11 Allergies  Allergen Reactions   Latex Itching and Rash    Social History   Tobacco Use   Smoking status: Never Smoker   Smokeless tobacco: Never Used  Substance Use Topics   Alcohol use: No    Family History  Problem Relation Age of Onset   Hypertension Mother       Review of Systems  Constitutional: negative for fatigue and weight  loss Respiratory: negative for cough and wheezing Cardiovascular: negative for chest pain, fatigue and palpitations Gastrointestinal: negative for abdominal pain and change in bowel habits Musculoskeletal:negative for myalgias Neurological: negative for gait problems and tremors Behavioral/Psych: negative for abusive relationship, depression Endocrine: negative for temperature intolerance    Genitourinary:negative for abnormal menstrual periods, genital lesions, hot flashes, sexual problems and vaginal discharge.  Positive for pelvic pain during intercourse Integument/breast: negative for breast lump, breast tenderness, nipple discharge and skin lesion(s)    Objective:       BP 103/72    Pulse 81    Ht 5\' 2"  (1.575 m)    Wt 113 lb (51.3 kg)    LMP 04/03/2020    BMI 20.67 kg/m  General:   alert and no distress  Skin:   no rash or abnormalities  Lungs:   clear to auscultation bilaterally  Heart:   regular rate and rhythm, S1, S2 normal, no murmur, click, rub or gallop  Breasts:   normal without suspicious masses, skin or nipple changes or axillary nodes  Abdomen:  normal findings: no organomegaly, soft, non-tender and no hernia  Pelvis:  External genitalia: normal general appearance Urinary system: urethral meatus normal and bladder without fullness, nontender Vaginal: normal without tenderness, induration or masses Cervix: normal appearance Adnexa: normal bimanual exam Uterus: anteverted and non-tender, normal size   Lab Review Urine pregnancy test Labs reviewed yes Radiologic studies reviewed no  50% of 25 min visit spent on counseling and coordination of  care.   Assessment:   1. Encounter for routine gynecological examination with Papanicolaou smear of cervix  2. Pelvic pain Rx: - US PELVIC COMPLETE WITH TRANSVAGINAL; Future  3. Deep dyspareunia  4. Vaginal discharge Rx: - Cervicovaginal ancillary only( [place])  5. Vesicle of skin.  R/O Genital Herpes Rx: -  Herpes simplex virus culture  6. Screen for STD (sexually transmitted disease) Rx: - HIV Antibody (routine testing w rflx) - Hepatitis B surface antigen - RPR - Hepatitis C antibody  7. Encounter for other general counseling and advice on contraception Rx: - Cytology - PAP( [place])  8. Encounter for initial prescription of contraceptive pills Rx: - levonorgestrel-ethinyl estradiol (NORDETTE) 0.15-30 MG-MCG tablet; Take 1 tablet by mouth daily.  Dispense: 1 Package; Refill: 11    Plan:    Education reviewed: calcium supplements, depression evaluation, low fat, low cholesterol diet, safe sex/STD prevention, self breast exams and weight bearing exercise. Contraception: OCP (estrogen/progesterone). Follow up in: 1 year.   Meds ordered this encounter  Medications   levonorgestrel-ethinyl estradiol (NORDETTE) 0.15-30 MG-MCG tablet    Sig: Take 1 tablet by mouth daily.    Dispense:  1 Package    Refill:  11   Orders Placed This Encounter  Procedures   Herpes simplex virus culture    Order Specific Question:   Source    Answer:   [facility]   US PELVIC COMPLETE WITH TRANSVAGINAL    Standing Status:   Future    Standing Expiration Date:   04/21/2021    Order Specific Question:   Reason for Exam (SYMPTOM  OR DIAGNOSIS REQUIRED)    Answer:   Pelvic pain.  Right ovarian cyst.    Order Specific Question:   Preferred imaging location?    Answer:   [facility]   HIV Antibody (routine testing w rflx)   Hepatitis B surface antigen   RPR   Hepatitis C antibody    Shelly Bombard, MD 04/21/2020 11:52 AM

## 2020-04-22 LAB — CYTOLOGY - PAP: Diagnosis: NEGATIVE

## 2020-04-22 LAB — HEPATITIS B SURFACE ANTIGEN: Hepatitis B Surface Ag: NEGATIVE

## 2020-04-22 LAB — RPR: RPR Ser Ql: NONREACTIVE

## 2020-04-22 LAB — HIV ANTIBODY (ROUTINE TESTING W REFLEX): HIV Screen 4th Generation wRfx: NONREACTIVE

## 2020-04-22 LAB — HEPATITIS C ANTIBODY: Hep C Virus Ab: <0.1 s/co ratio (ref 0.0–0.9)

## 2020-04-24 LAB — HERPES SIMPLEX VIRUS CULTURE

## 2020-04-25 ENCOUNTER — Other Ambulatory Visit: Payer: Self-pay | Admitting: Obstetrics

## 2020-04-25 DIAGNOSIS — N760 Acute vaginitis: Secondary | ICD-10-CM

## 2020-04-25 LAB — CERVICOVAGINAL ANCILLARY ONLY
Bacterial Vaginitis (gardnerella): POSITIVE — AB
Candida Glabrata: NEGATIVE
Candida Vaginitis: NEGATIVE
Chlamydia: NEGATIVE
Comment: NEGATIVE
Comment: NEGATIVE
Comment: NEGATIVE
Comment: NEGATIVE
Comment: NEGATIVE
Comment: NORMAL
Neisseria Gonorrhea: NEGATIVE
Trichomonas: NEGATIVE

## 2020-04-25 MED ORDER — TINIDAZOLE 500 MG PO TABS: 1000.0000 mg | ORAL_TABLET | Freq: Every day | ORAL | 2 refills | Status: DC

## 2020-05-03 ENCOUNTER — Other Ambulatory Visit: Payer: 59

## 2020-05-12 ENCOUNTER — Other Ambulatory Visit: Payer: 59

## 2020-06-02 ENCOUNTER — Other Ambulatory Visit: Payer: 59

## 2020-06-20 ENCOUNTER — Other Ambulatory Visit (HOSPITAL_COMMUNITY)
Admission: RE | Admit: 2020-06-20 | Discharge: 2020-06-20 | Disposition: A | Discharge status: Home / Self Care | Payer: Medicaid Other | Source: Ambulatory Visit | Attending: Obstetrics | Admitting: Obstetrics

## 2020-06-20 ENCOUNTER — Ambulatory Visit (INDEPENDENT_AMBULATORY_CARE_PROVIDER_SITE_OTHER): Payer: 59 | Admitting: Obstetrics

## 2020-06-20 ENCOUNTER — Other Ambulatory Visit: Payer: Self-pay

## 2020-06-20 ENCOUNTER — Encounter: Payer: Self-pay | Admitting: Obstetrics

## 2020-06-20 VITALS — BP 114/76 | HR 80 | Ht 62.0 in | Wt 110.9 lb

## 2020-06-20 DIAGNOSIS — N760 Acute vaginitis: Secondary | ICD-10-CM | POA: Diagnosis not present

## 2020-06-20 DIAGNOSIS — N898 Other specified noninflammatory disorders of vagina: Secondary | ICD-10-CM | POA: Diagnosis present

## 2020-06-20 DIAGNOSIS — Z113 Encounter for screening for infections with a predominantly sexual mode of transmission: Secondary | ICD-10-CM | POA: Insufficient documentation

## 2020-06-20 DIAGNOSIS — B9689 Other specified bacterial agents as the cause of diseases classified elsewhere: Secondary | ICD-10-CM | POA: Insufficient documentation

## 2020-06-20 LAB — POCT URINE PREGNANCY: Preg Test, Ur: NEGATIVE

## 2020-06-20 NOTE — Progress Notes (Signed)
Pt is in the office reporting vaginal discharge and odor. Pt states that LMP 05-27-20 was only spotting for 3 days, UPT today was negative.

## 2020-06-20 NOTE — Progress Notes (Signed)
Patient ID: Melanie Andrews, female   DOB: 1996-10-16, 25 y.o.   MRN: [id number]  Chief Complaint  Patient presents with  . GYN    HPI Melanie Andrews is a 24 y.o. female.  Complains of vaginal discharge with odor. HPI  History reviewed. No pertinent past medical history.  Past Surgical History:  Procedure Laterality Date  . TONSILLECTOMY      Family History  Problem Relation Age of Onset  . Hypertension Mother     Social History Social History   Tobacco Use  . Smoking status: Never Smoker  . Smokeless tobacco: Never Used  Vaping Use  . Vaping Use: Never used  Substance Use Topics  . Alcohol use: No  . Drug use: Yes    Frequency: 5.0 times per week    Types: Marijuana    Allergies  Allergen Reactions  . Latex Itching and Rash    Current Outpatient Medications  Medication Sig Dispense Refill  . levonorgestrel-ethinyl estradiol (NORDETTE) 0.15-30 MG-MCG tablet Take 1 tablet by mouth daily. (Patient not taking: Reported on 06/20/2020) 1 Package 11  . tinidazole (TINDAMAX) 500 MG tablet Take 2 tablets (1,000 mg total) by mouth daily with breakfast. (Patient not taking: Reported on 06/20/2020) 10 tablet 2   No current facility-administered medications for this visit.    Review of Systems Review of Systems Constitutional: negative for fatigue and weight loss Respiratory: negative for cough and wheezing Cardiovascular: negative for chest pain, fatigue and palpitations Gastrointestinal: negative for abdominal pain and change in bowel habits Genitourinary:positive for vaginal discharge with odor Integument/breast: negative for nipple discharge Musculoskeletal:negative for myalgias Neurological: negative for gait problems and tremors Behavioral/Psych: negative for abusive relationship, depression Endocrine: negative for temperature intolerance      Blood pressure 114/76, pulse 80, height 5\' 2"  (1.575 m), weight 110 lb 14.4 oz (50.3 kg), last menstrual period  05/27/2020.  Physical Exam Physical Exam           General: Alert and no distress Abdomen:  normal findings: no organomegaly, soft, non-tender and no hernia  Pelvis:  External genitalia: normal general appearance Urinary system: urethral meatus normal and bladder without fullness, nontender Vaginal: normal without tenderness, induration or masses Cervix: normal appearance Adnexa: normal bimanual exam Uterus: anteverted and non-tender, normal size    50% of 15 min visit spent on counseling and coordination of care.   Data Reviewed Wet Prep Cultures  Assessment     1. Vaginal discharge Rx: - Cervicovaginal ancillary only( [place]) - POCT urine pregnancy    Plan   Follow up prn  Orders Placed This Encounter  Procedures  . POCT urine pregnancy     07/28/2020, MD 06/20/2020 10:31 AM

## 2020-06-21 ENCOUNTER — Other Ambulatory Visit: Payer: Self-pay | Admitting: Obstetrics

## 2020-06-21 ENCOUNTER — Encounter: Payer: Self-pay | Admitting: *Deleted

## 2020-06-21 ENCOUNTER — Telehealth: Payer: Self-pay

## 2020-06-21 DIAGNOSIS — B9689 Other specified bacterial agents as the cause of diseases classified elsewhere: Secondary | ICD-10-CM

## 2020-06-21 LAB — CERVICOVAGINAL ANCILLARY ONLY
Bacterial Vaginitis (gardnerella): POSITIVE — AB
Candida Glabrata: NEGATIVE
Candida Vaginitis: NEGATIVE
Chlamydia: NEGATIVE
Comment: NEGATIVE
Comment: NEGATIVE
Comment: NEGATIVE
Comment: NEGATIVE
Comment: NEGATIVE
Comment: NORMAL
Neisseria Gonorrhea: NEGATIVE
Trichomonas: NEGATIVE

## 2020-06-21 MED ORDER — TINIDAZOLE 500 MG PO TABS: 1000.0000 mg | ORAL_TABLET | Freq: Every day | ORAL | 2 refills | Status: DC

## 2020-06-21 NOTE — Telephone Encounter (Signed)
-----   Message from Brock Bad, MD sent at 06/21/2020  1:44 PM EDT ----- Tindamax Rx for BV

## 2020-06-21 NOTE — Telephone Encounter (Signed)
Called pt regarding positive BV results. Pt made aware that Tindamax 500 mg was sent to her pharmacy. Understanding was voiced.  Melanie Andrews l Melanie Andrews, CMA

## 2020-10-19 ENCOUNTER — Ambulatory Visit: Payer: Self-pay

## 2020-11-20 ENCOUNTER — Other Ambulatory Visit: Payer: Self-pay

## 2020-11-20 ENCOUNTER — Encounter (HOSPITAL_COMMUNITY): Payer: Self-pay

## 2020-11-20 ENCOUNTER — Emergency Department (HOSPITAL_COMMUNITY)
Admission: EM | Admit: 2020-11-20 | Discharge: 2020-11-20 | Disposition: A | Discharge status: Home / Self Care | Payer: 59 | Attending: Emergency Medicine | Admitting: Emergency Medicine

## 2020-11-20 DIAGNOSIS — R45851 Suicidal ideations: Secondary | ICD-10-CM | POA: Insufficient documentation

## 2020-11-20 DIAGNOSIS — R4589 Other symptoms and signs involving emotional state: Secondary | ICD-10-CM

## 2020-11-20 DIAGNOSIS — Z739 Problem related to life management difficulty, unspecified: Secondary | ICD-10-CM | POA: Insufficient documentation

## 2020-11-20 DIAGNOSIS — Z91040 Latex allergy status: Secondary | ICD-10-CM | POA: Insufficient documentation

## 2020-11-20 DIAGNOSIS — F32A Depression, unspecified: Secondary | ICD-10-CM

## 2020-11-20 LAB — RAPID URINE DRUG SCREEN, HOSP PERFORMED
Amphetamines: NOT DETECTED
Barbiturates: NOT DETECTED
Benzodiazepines: NOT DETECTED
Cocaine: NOT DETECTED
Opiates: NOT DETECTED
Tetrahydrocannabinol: NOT DETECTED

## 2020-11-20 NOTE — ED Notes (Signed)
Per Nanvati MD IVC paperwork is being resended and pt is being discharged.

## 2020-11-20 NOTE — ED Notes (Signed)
Pt given burgundy scrubs and a patient belongings bag to collect her things. Pt changing in the bathroom and providing urine sample at this time.

## 2020-11-20 NOTE — ED Triage Notes (Signed)
Pt BIB EMS from home. Pt reports taking 10 200mg  ibuprofen and 4 shots of tequila in attempt to harm herself. Pts mother found suicide notes around the house and then found her daughter with a knife beside her getting ready to harm herself. Pts mother is currently taking out IVC paperwork.

## 2020-11-20 NOTE — ED Notes (Signed)
[employer] served IVC paperwork

## 2020-11-21 NOTE — ED Provider Notes (Signed)
[facility]-EMERGENCY DEPT Provider Note   CSN: [id number] Arrival date & time: 11/20/20  1711     History Chief Complaint  Patient presents with  . Suicidal    Melanie Andrews is a 25 y.o. female.  HPI    25 year old female brought into the ER with chief complaint of SI.  Patient has history of depression.  She is not taking her medications.  She has not seen her therapist for more than a year.   EMS was called on scene by patient's brother.  Patient had sent some concerning text messages to him and patient's mother.  The mother has also initiated IVC process per EMS.   According to the IVC note, patient had drank alcohol and had a knife by her.  She also had written suicide note.  Patient is wanting to go home.  She reports that she had a rough 2021 and is dealing with some family drama.  She had an unpleasant encounter with her supervisor at work today.  She went home and called her mother, and she got into an argument with her mother.  That led her to texting her mother that maybe she will be better off dead and everyone would be happier without her.  She has a tenuous relationship with her mother and her father.  She took ibuprofen because of her headache.  She drank alcohol because of her headache.  She was not intoxicated.  She has knives in her room to protect her as she lives with a roommate.  She has never cut herself.  She has not required psychiatric admission anytime recently.  Patient has a full-time job at [employer] and she has a side business.  Spoke with patient's brother.  He states that he got concerned because of the text message and called 911 for wellness visit as he was driving towards the patient.  He had already received a text message from the patient informing him that he would want to come home and he thinks that is fine.  He thinks that the patient just needs someone like him to be by her side and calm her down.  He is upset at his  mother for initiating IVC paperwork.  He tells me that patient does not get along with her mother or her father.  Patient's aunt is also with the brother.  She agrees to everything that the brother had to say and adds that she too agrees that the patient will be better off coming home with family around her than being admitted.  Both are willing to be the responsible adult to make sure that patient is focusing on getting better.  Later this day I spoke with patient's mother.  She informed me that patient sent her the concerning text and then stopped responding, which is why they got concerned and called EMS.  She also saw a suicide letters.  When I confronted the patient about the letters, she states that writing letter is how she expresses herself.  Although patient's mother has completed the IVC paperwork, she is amenable to following suggestion by our team.  The aunt and patient's brother are adamant about wanting to take her home.  Patient also wants to go home.  She admits that she should have started seeing therapist last year.  She admits that she was emotional and reactive earlier today and expresses over and over again divorce of her action and states that she had no intention to hurt herself.  She denies any substance abuse history.  She has health insurance and ability to see a therapist for free through work.  She thinks that seeing a therapist and perhaps a psychiatrist might be the next best step for her.  History reviewed. No pertinent past medical history.  Patient Active Problem List   Diagnosis Date Noted  . History of abdominal pain 10/22/2016  . Encounter for initial prescription of contraceptive pills 10/22/2016    Past Surgical History:  Procedure Laterality Date  . TONSILLECTOMY       OB History    Gravida  0   Para  0   Term  0   Preterm  0   AB  0   Living  0     SAB  0   IAB  0   Ectopic  0   Multiple  0   Live Births  0           Family  History  Problem Relation Age of Onset  . Hypertension Mother     Social History   Tobacco Use  . Smoking status: Never Smoker  . Smokeless tobacco: Never Used  Vaping Use  . Vaping Use: Never used  Substance Use Topics  . Alcohol use: No  . Drug use: Yes    Frequency: 5.0 times per week    Types: Marijuana    Home Medications Prior to Admission medications   Medication Sig Start Date End Date Taking? Authorizing Provider  levonorgestrel-ethinyl estradiol (NORDETTE) 0.15-30 MG-MCG tablet Take 1 tablet by mouth daily. Patient not taking: Reported on 06/20/2020 04/21/20   Shelly Bombard, MD  tinidazole [facility]) 500 MG tablet Take 2 tablets (1,000 mg total) by mouth daily with breakfast. 06/21/20   Shelly Bombard, MD    Allergies    Latex  Review of Systems   Review of Systems  Physical Exam Updated Vital Signs BP 115/74 (BP Location: Right Arm)   Pulse 69   Temp 98.9 F (37.2 C) (Oral)   Resp 18   SpO2 100%   Physical Exam Vitals and nursing note reviewed.  Constitutional:      Appearance: She is well-developed.  HENT:     Head: Normocephalic and atraumatic.  Eyes:     Extraocular Movements: EOM normal.  Cardiovascular:     Rate and Rhythm: Normal rate.  Pulmonary:     Effort: Pulmonary effort is normal.  Abdominal:     General: Bowel sounds are normal.  Musculoskeletal:     Cervical back: Normal range of motion and neck supple.  Skin:    General: Skin is warm and dry.  Neurological:     Mental Status: She is alert and oriented to person, place, and time.  Psychiatric:     Comments: Abnormal judgment, thought content is normal. Patient has normal affect and although was irritable but nervous, she was easily directable by me and participated in the history, cooperated with me in allowing me to get surrogate history.     ED Results / Procedures / Treatments   Labs (all labs ordered are listed, but only abnormal results are displayed) Labs Reviewed   RAPID URINE DRUG SCREEN, HOSP PERFORMED    EKG None  Radiology No results found.  Procedures .Critical Care Performed by: Varney Biles, MD Authorized by: Varney Biles, MD   Critical care provider statement:    Critical care time (minutes):  81   Critical care was necessary to treat or prevent imminent  or life-threatening deterioration of the following conditions:  CNS failure or compromise   Critical care was time spent personally by me on the following activities:  Discussions with consultants, evaluation of patient's response to treatment, examination of patient, ordering and performing treatments and interventions, ordering and review of laboratory studies, ordering and review of radiographic studies, pulse oximetry, re-evaluation of patient's condition, obtaining history from patient or surrogate and review of old charts   (including critical care time)  Medications Ordered in ED Medications - No data to display  ED Course  I have reviewed the triage vital signs and the nursing notes.  Pertinent labs & imaging results that were available during my care of the patient were reviewed by me and considered in my medical decision making (see chart for details).    MDM Rules/Calculators/A&P                         25 year old female with history of depression brought in by family via EMS for SI.  It does appear that patient expressed to the family suicidal thoughts.  She admits to it freely.  He states that she never had any intent of wanting to harm herself.  She has a complex family dynamic and perhaps does not get along really well with her mother and father.  Her brother and her aunt want her to come home.  They want to be supportive for her and think that it might be best for her to be with family and get outpatient therapist evaluation.  They they want to take responsibility and ensuring that the patient follows through.  The patient, who I examined independent of the  family, also admitted that she needs some help and is willing to take the steps to see the therapist.  I think patient is having difficulty with coping skills.  I do think she has depression and I informed her that although she has remorse of her behavior, living in depressed state might lead to a snap poor judgment by her in future with a triggering event -and that is a concern for [place].  We absolutely need for her to follow-up with a therapist to get better.  Patient has a job, side business and has aspirations.  Ultimately I think it is probably safe to send her home.  There is some attention seeking behavior, some coping issues and depression.  I do not think she has substance abuse problems.  She has not required psych admission anytime recently.  She has supportive family with her.  The family is also willing to take responsibility and ensuring that patient gets better and gets help, and patient is expressing desire herself to follow-up with the therapist.  I think it is best to discharge the patient and allow her to continue with the outpatient management.  I expressed my thoughts and the rationale with patient's family, and they are also agreeable to this plan.  Contract for safety signed by the patient, her brother.  I will rescind the IVC paperwork.  Strict ER return precautions have been discussed, and patient is agreeing with the plan and is comfortable with the workup done and the recommendations from the ER.    Final Clinical Impression(s) / ED Diagnoses Final diagnoses:  Depression, unspecified depression type  Suicidal thoughts  Ineffective coping    Rx / DC Orders ED Discharge Orders    None       Derwood Kaplan, MD 11/21/20 2304

## 2021-02-07 ENCOUNTER — Ambulatory Visit: Payer: Medicaid Other | Admitting: Obstetrics

## 2021-03-28 ENCOUNTER — Encounter (HOSPITAL_COMMUNITY): Payer: Self-pay

## 2021-03-28 ENCOUNTER — Ambulatory Visit (HOSPITAL_COMMUNITY)
Admission: EM | Admit: 2021-03-28 | Discharge: 2021-03-28 | Disposition: A | Discharge status: Home / Self Care | Payer: Self-pay | Attending: Student | Admitting: Student

## 2021-03-28 ENCOUNTER — Other Ambulatory Visit: Payer: Self-pay

## 2021-03-28 DIAGNOSIS — Z113 Encounter for screening for infections with a predominantly sexual mode of transmission: Secondary | ICD-10-CM

## 2021-03-28 DIAGNOSIS — Z3202 Encounter for pregnancy test, result negative: Secondary | ICD-10-CM

## 2021-03-28 DIAGNOSIS — N760 Acute vaginitis: Secondary | ICD-10-CM | POA: Insufficient documentation

## 2021-03-28 DIAGNOSIS — G44209 Tension-type headache, unspecified, not intractable: Secondary | ICD-10-CM

## 2021-03-28 DIAGNOSIS — R197 Diarrhea, unspecified: Secondary | ICD-10-CM

## 2021-03-28 DIAGNOSIS — B9689 Other specified bacterial agents as the cause of diseases classified elsewhere: Secondary | ICD-10-CM

## 2021-03-28 LAB — POCT URINALYSIS DIPSTICK, ED / UC
Bilirubin Urine: NEGATIVE
Glucose, UA: NEGATIVE mg/dL
Hgb urine dipstick: NEGATIVE
Ketones, ur: NEGATIVE mg/dL
Leukocytes,Ua: NEGATIVE
Nitrite: NEGATIVE
Protein, ur: NEGATIVE mg/dL
Specific Gravity, Urine: 1.02 (ref 1.005–1.030)
Urobilinogen, UA: 0.2 mg/dL (ref 0.0–1.0)
pH: 7 (ref 5.0–8.0)

## 2021-03-28 LAB — POC URINE PREG, ED: Preg Test, Ur: NEGATIVE

## 2021-03-28 MED ORDER — METRONIDAZOLE 500 MG PO TABS: 500.0000 mg | ORAL_TABLET | Freq: Two times a day (BID) | ORAL | 0 refills | Status: DC

## 2021-03-28 NOTE — ED Provider Notes (Addendum)
[facility]    CSN: [id number] Arrival date & time: 03/28/21  1120      History   Chief Complaint Chief Complaint  Patient presents with  . Abdominal Pain    HPI Melanie Andrews is a 25 y.o. female presenting with lower abd pain.  Medical history of recurrent BV per patient and per chart review, as well as abdominal pain.  Patient reports her last STI screen was [facility] weeks ago [facility] the [facility], I do not have record of this but patient states this was negative.  Denies any partners.  Describes 1 week of intermittent crampy lower abdominal pain R>L with 1 episode of diarrhea 3 days ago, denies any more diarrhea, nausea, vomiting since then; last bowel movement was today and was normal and she is still passing gas.  Copious thin clear vaginal discharge with some external vaginal irritation. LMP 4/23. Not currently taking contraceptive; concerned she is pregnant. Denies hematuria, dysuria, frequency, urgency, back pain, n/v/d, fevers/chills, new partners.   HPI  History reviewed. No pertinent past medical history.  Patient Active Problem List   Diagnosis Date Noted  . History of abdominal pain 10/22/2016  . Encounter for initial prescription of contraceptive pills 10/22/2016    Past Surgical History:  Procedure Laterality Date  . TONSILLECTOMY      OB History    Gravida  0   Para  0   Term  0   Preterm  0   AB  0   Living  0     SAB  0   IAB  0   Ectopic  0   Multiple  0   Live Births  0            Home Medications    Prior to Admission medications   Medication Sig Start Date End Date Taking? Authorizing Provider  metroNIDAZOLE (FLAGYL) 500 MG tablet Take 1 tablet (500 mg total) by mouth 2 (two) times daily. 03/28/21  Yes Rhys Martini, PA-C  levonorgestrel-ethinyl estradiol (NORDETTE) 0.15-30 MG-MCG tablet Take 1 tablet by mouth daily. Patient not taking: Reported on 06/20/2020 04/21/20 03/28/21  Brock Bad, MD    Family  History Family History  Problem Relation Age of Onset  . Hypertension Mother     Social History Social History   Tobacco Use  . Smoking status: Never Smoker  . Smokeless tobacco: Never Used  Vaping Use  . Vaping Use: Never used  Substance Use Topics  . Alcohol use: No  . Drug use: Yes    Frequency: 5.0 times per week    Types: Marijuana     Allergies   Latex   Review of Systems Review of Systems  Constitutional: Negative for chills and fever.  HENT: Negative for sore throat.   Eyes: Negative for pain and redness.  Respiratory: Negative for shortness of breath.   Cardiovascular: Negative for chest pain.  Gastrointestinal: Positive for abdominal pain. Negative for diarrhea, nausea and vomiting.  Genitourinary: Positive for vaginal discharge. Negative for decreased urine volume, difficulty urinating, dysuria, flank pain, frequency, genital sores, hematuria, menstrual problem, pelvic pain, urgency, vaginal bleeding and vaginal pain.  Musculoskeletal: Negative for back pain.  Skin: Negative for rash.  All other systems reviewed and are negative.    Physical Exam Triage Vital Signs ED Triage Vitals  Enc Vitals Group     BP 03/28/21 1254 96/64     Pulse Rate 03/28/21 1254 77     Resp  03/28/21 1254 17     Temp 03/28/21 1254 98.4 F (36.9 C)     Temp Source 03/28/21 1254 Oral     SpO2 03/28/21 1254 97 %     Weight --      Height --      Head Circumference --      Peak Flow --      Pain Score 03/28/21 1252 5     Pain Loc --      Pain Edu? --      Excl. in GC? --    No data found.  Updated Vital Signs BP 96/64 (BP Location: Left Arm)   Pulse 77   Temp 98.4 F (36.9 C) (Oral)   Resp 17   LMP 03/11/2021   SpO2 97%   Visual Acuity Right Eye Distance:   Left Eye Distance:   Bilateral Distance:    Right Eye Near:   Left Eye Near:    Bilateral Near:     Physical Exam Vitals reviewed.  Constitutional:      General: She is not in acute distress.     Appearance: Normal appearance. She is not ill-appearing.  HENT:     Head: Normocephalic and atraumatic.     Mouth/Throat:     Mouth: Mucous membranes are moist.     Comments: Moist mucous membranes Eyes:     Extraocular Movements: Extraocular movements intact.     Pupils: Pupils are equal, round, and reactive to light.  Cardiovascular:     Rate and Rhythm: Normal rate and regular rhythm.     Heart sounds: Normal heart sounds.  Pulmonary:     Effort: Pulmonary effort is normal.     Breath sounds: Normal breath sounds. No wheezing, rhonchi or rales.  Abdominal:     General: Bowel sounds are normal. There is no distension.     Palpations: Abdomen is soft. There is no mass.     Tenderness: There is abdominal tenderness in the right lower quadrant, suprapubic area and left lower quadrant. There is no right CVA tenderness, left CVA tenderness, guarding or rebound. Negative signs include Murphy's sign, Rovsing's sign and McBurney's sign.  Genitourinary:    Comments: deferred Skin:    General: Skin is warm.     Capillary Refill: Capillary refill takes less than 2 seconds.     Comments: Good skin turgor  Neurological:     General: No focal deficit present.     Mental Status: She is alert and oriented to person, place, and time.     Cranial Nerves: Cranial nerves are intact. No cranial nerve deficit.     Sensory: Sensation is intact.     Motor: Motor function is intact. No weakness.     Coordination: Coordination is intact.     Gait: Gait is intact. Gait normal.     Comments: PERRLA, EOMI. CN 2-12 grossly intact.  Psychiatric:        Mood and Affect: Mood normal.        Behavior: Behavior normal.      UC Treatments / Results  Labs (all labs ordered are listed, but only abnormal results are displayed) Labs Reviewed  POCT URINALYSIS DIPSTICK, ED / UC  POC URINE PREG, ED  CERVICOVAGINAL ANCILLARY ONLY    EKG   Radiology No results found.  Procedures Procedures (including  critical care time)  Medications Ordered in UC Medications - No data to display  Initial Impression / Assessment and Plan / UC Course  I have reviewed the triage vital signs and the nursing notes.  Pertinent labs & imaging results that were available during my care of the patient were reviewed by me and considered in my medical decision making (see chart for details).     This patient is a 25 year old female presenting with suspected recurrent BV. Today this pt is afebrile nontachycardic nontachypneic, appears healthy and resting comfortably. Some suprapubic tenderness, but no CVAT.  Self swab sent for gonorrhea, chlamydia, trichomonas, BV, yeast. Pt states she was at the health dept 2 weeks ago and had full STI panel at that time that was negative. Denies new partners since then. Low suspicion for PID. UA wnl. Urine pregnancy negative.  BP is 96/64, pt's typical range is low 100s/70s. Asymptomatic.  Plan to treat for suspected BV with Flagyl as below.  ED return precautions discussed. For tension headaches, ibuprofen up to 800mg  tid.  Coding this visit a Level 4 for review of past notes (06/2020 for BV) and labs (06/2020), ordering of each test, and prescription drug management.   Final Clinical Impressions(s) / UC Diagnoses   Final diagnoses:  Negative pregnancy test  Tension headache  Routine screening for STI (sexually transmitted infection)  BV (bacterial vaginosis)  Diarrhea, unspecified type     Discharge Instructions     -Start the antibiotic, Flagyl (metronidazole), 2 pills daily for 7 days.  This antibiotic can cause nausea if you take with alcohol, so avoid alcohol while taking it. -Ibuprofen up to 800 mg for headaches.  Make sure to take this with food. -Seek additional immediate medical attention if you develop new symptoms like worsening of the abdominal pain, worsening of the back pain, new fever/chills, the worst headache of your life, vision changes,  etc.     ED Prescriptions    Medication Sig Dispense Auth. Provider   metroNIDAZOLE (FLAGYL) 500 MG tablet Take 1 tablet (500 mg total) by mouth 2 (two) times daily. 14 tablet 07-28-1999, PA-C     PDMP not reviewed this encounter.   Rhys Martini, PA-C 03/28/21 1430    05/28/21, PA-C 03/28/21 1431    05/28/21, PA-C 03/28/21 1620

## 2021-03-28 NOTE — Discharge Instructions (Signed)
-  Start the antibiotic, Flagyl (metronidazole), 2 pills daily for 7 days.  This antibiotic can cause nausea if you take with alcohol, so avoid alcohol while taking it. -Ibuprofen up to 800 mg for headaches.  Make sure to take this with food. -Seek additional immediate medical attention if you develop new symptoms like worsening of the abdominal pain, worsening of the back pain, new fever/chills, the worst headache of your life, vision changes, etc.

## 2021-03-28 NOTE — ED Triage Notes (Signed)
Pt presents with lower abdominal pain with one episode of diarrhea X 3 days ago.

## 2021-03-29 ENCOUNTER — Telehealth (HOSPITAL_COMMUNITY): Payer: Self-pay | Admitting: Emergency Medicine

## 2021-03-29 LAB — CERVICOVAGINAL ANCILLARY ONLY
Bacterial Vaginitis (gardnerella): NEGATIVE
Candida Glabrata: NEGATIVE
Candida Vaginitis: POSITIVE — AB
Chlamydia: NEGATIVE
Comment: NEGATIVE
Comment: NEGATIVE
Comment: NEGATIVE
Comment: NEGATIVE
Comment: NEGATIVE
Comment: NORMAL
Neisseria Gonorrhea: NEGATIVE
Trichomonas: NEGATIVE

## 2021-03-29 MED ORDER — FLUCONAZOLE 150 MG PO TABS: 150.0000 mg | ORAL_TABLET | Freq: Once | ORAL | 0 refills | Status: AC

## 2021-04-11 ENCOUNTER — Telehealth: Payer: Self-pay

## 2021-04-11 ENCOUNTER — Ambulatory Visit (HOSPITAL_COMMUNITY)
Admission: RE | Admit: 2021-04-11 | Discharge: 2021-04-11 | Disposition: A | Discharge status: Home / Self Care | Payer: Self-pay | Attending: Psychiatry | Admitting: Psychiatry

## 2021-04-11 NOTE — Telephone Encounter (Signed)
RTC to pt regarding vm she mentioned "mental issues" and requested Rx by Dr.Harper  Pt was not ava LVM
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
## 2021-04-28 ENCOUNTER — Ambulatory Visit (HOSPITAL_COMMUNITY): Payer: Self-pay | Admitting: Physician Assistant

## 2021-05-01 ENCOUNTER — Other Ambulatory Visit: Payer: Self-pay

## 2021-05-01 ENCOUNTER — Ambulatory Visit (INDEPENDENT_AMBULATORY_CARE_PROVIDER_SITE_OTHER): Payer: No Payment, Other | Admitting: Licensed Clinical Social Worker

## 2021-05-01 DIAGNOSIS — F411 Generalized anxiety disorder: Secondary | ICD-10-CM

## 2021-05-01 DIAGNOSIS — F332 Major depressive disorder, recurrent severe without psychotic features: Secondary | ICD-10-CM | POA: Diagnosis not present
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
## 2021-05-04 ENCOUNTER — Ambulatory Visit (INDEPENDENT_AMBULATORY_CARE_PROVIDER_SITE_OTHER): Payer: No Payment, Other | Admitting: Physician Assistant

## 2021-05-04 ENCOUNTER — Other Ambulatory Visit: Payer: Self-pay

## 2021-05-04 ENCOUNTER — Ambulatory Visit (HOSPITAL_COMMUNITY): Payer: Self-pay | Admitting: Physician Assistant

## 2021-05-04 ENCOUNTER — Encounter (HOSPITAL_COMMUNITY): Payer: Self-pay | Admitting: Physician Assistant

## 2021-05-04 VITALS — BP 118/77 | HR 90 | Ht 62.0 in | Wt 108.0 lb

## 2021-05-04 DIAGNOSIS — F5105 Insomnia due to other mental disorder: Secondary | ICD-10-CM

## 2021-05-04 DIAGNOSIS — F411 Generalized anxiety disorder: Secondary | ICD-10-CM | POA: Diagnosis not present

## 2021-05-04 DIAGNOSIS — F332 Major depressive disorder, recurrent severe without psychotic features: Secondary | ICD-10-CM

## 2021-05-04 DIAGNOSIS — F99 Mental disorder, not otherwise specified: Secondary | ICD-10-CM | POA: Diagnosis not present
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
## 2021-05-05 DIAGNOSIS — F5105 Insomnia due to other mental disorder: Secondary | ICD-10-CM | POA: Insufficient documentation

## 2021-05-05 DIAGNOSIS — F99 Mental disorder, not otherwise specified: Secondary | ICD-10-CM | POA: Insufficient documentation

## 2021-05-05 MED ORDER — HYDROXYZINE HCL 10 MG PO TABS: 10.0000 mg | ORAL_TABLET | Freq: Three times a day (TID) | ORAL | 1 refills | Status: AC | PRN

## 2021-05-05 MED ORDER — TRAZODONE HCL 50 MG PO TABS: 25.0000 mg | ORAL_TABLET | Freq: Every day | ORAL | 1 refills | Status: DC

## 2021-05-05 MED ORDER — ESCITALOPRAM OXALATE 10 MG PO TABS: 10.0000 mg | ORAL_TABLET | Freq: Every day | ORAL | 1 refills | Status: DC

## 2021-05-07 ENCOUNTER — Encounter (HOSPITAL_COMMUNITY): Payer: Self-pay | Admitting: Physician Assistant

## 2021-06-01 ENCOUNTER — Ambulatory Visit (HOSPITAL_COMMUNITY): Payer: Self-pay | Admitting: Licensed Clinical Social Worker

## 2021-06-06 ENCOUNTER — Ambulatory Visit (HOSPITAL_COMMUNITY): Payer: Self-pay | Admitting: Licensed Clinical Social Worker

## 2021-06-22 ENCOUNTER — Encounter (HOSPITAL_COMMUNITY): Payer: No Payment, Other | Admitting: Physician Assistant

## 2021-07-04 ENCOUNTER — Ambulatory Visit (HOSPITAL_COMMUNITY): Payer: Self-pay | Admitting: Licensed Clinical Social Worker

## 2021-07-04 ENCOUNTER — Ambulatory Visit: Payer: Medicaid Other

## 2021-07-31 ENCOUNTER — Encounter (HOSPITAL_COMMUNITY): Payer: No Payment, Other | Admitting: Physician Assistant

## 2021-08-02 ENCOUNTER — Ambulatory Visit (HOSPITAL_COMMUNITY)
Admission: EM | Admit: 2021-08-02 | Discharge: 2021-08-02 | Disposition: A | Discharge status: Home / Self Care | Payer: Self-pay

## 2021-08-02 ENCOUNTER — Other Ambulatory Visit: Payer: Self-pay

## 2021-08-02 NOTE — ED Notes (Signed)
Pt called on mobile # listed in Chart with no answer.

## 2021-08-02 NOTE — ED Triage Notes (Signed)
Pt called from front lobby with no answer 

## 2021-08-11 ENCOUNTER — Ambulatory Visit: Payer: Medicaid Other

## 2021-08-30 ENCOUNTER — Other Ambulatory Visit (HOSPITAL_COMMUNITY): Payer: Self-pay | Admitting: Physician Assistant

## 2021-08-30 DIAGNOSIS — F332 Major depressive disorder, recurrent severe without psychotic features: Secondary | ICD-10-CM

## 2021-08-30 DIAGNOSIS — F411 Generalized anxiety disorder: Secondary | ICD-10-CM

## 2021-08-30 NOTE — Telephone Encounter (Signed)
Patient missed last appointment. Provider will prescribe a 30-day supply of her medication. Will follow up with patient to schedule upcoming appointment.

## 2022-01-09 ENCOUNTER — Ambulatory Visit: Payer: Medicaid Other

## 2022-03-13 ENCOUNTER — Telehealth (HOSPITAL_COMMUNITY): Payer: Self-pay | Admitting: Physician Assistant

## 2022-03-13 ENCOUNTER — Other Ambulatory Visit (HOSPITAL_COMMUNITY): Payer: Self-pay | Admitting: Physician Assistant

## 2022-03-13 DIAGNOSIS — F332 Major depressive disorder, recurrent severe without psychotic features: Secondary | ICD-10-CM

## 2022-03-13 DIAGNOSIS — F411 Generalized anxiety disorder: Secondary | ICD-10-CM

## 2022-03-13 MED ORDER — ESCITALOPRAM OXALATE 10 MG PO TABS: 10.0000 mg | ORAL_TABLET | Freq: Every day | ORAL | 0 refills | Status: DC

## 2022-03-13 NOTE — Telephone Encounter (Signed)
Med refill escitalopram (LEXAPRO) 10 MG tablet  ?

## 2022-03-13 NOTE — Progress Notes (Signed)
Provider was contacted by Cecille Amsterdam. Foster regarding medication refill request from this patient.  Due to patient missing previous encounter, provider to bridge patient's medication until next encounter scheduled for 03/30/2022.  Patient to be given an 18-day supply of her Lexapro prescription.  Patient's medication to be e-prescribed to pharmacy of choice. ?

## 2022-03-13 NOTE — Telephone Encounter (Signed)
Provider was contacted by Cecille Amsterdam. Foster regarding medication refill request from this patient.  Due to patient missing previous encounter, provider to bridge patient's medication until next encounter scheduled for 03/30/2022.  Patient's medication to be e-prescribed to pharmacy of choice.

## 2022-03-22 ENCOUNTER — Ambulatory Visit (INDEPENDENT_AMBULATORY_CARE_PROVIDER_SITE_OTHER): Payer: Self-pay | Admitting: Obstetrics

## 2022-03-22 ENCOUNTER — Encounter: Payer: Self-pay | Admitting: Obstetrics

## 2022-03-22 VITALS — BP 113/74 | HR 81 | Ht 62.0 in | Wt 112.6 lb

## 2022-03-22 DIAGNOSIS — Z30011 Encounter for initial prescription of contraceptive pills: Secondary | ICD-10-CM

## 2022-03-22 DIAGNOSIS — N943 Premenstrual tension syndrome: Secondary | ICD-10-CM

## 2022-03-22 DIAGNOSIS — Z3009 Encounter for other general counseling and advice on contraception: Secondary | ICD-10-CM

## 2022-03-22 MED ORDER — FLUOXETINE HCL 10 MG PO CAPS: 10.0000 mg | ORAL_CAPSULE | Freq: Every day | ORAL | 11 refills | Status: DC

## 2022-03-22 MED ORDER — LO LOESTRIN FE 1 MG-10 MCG / 10 MCG PO TABS: 1.0000 | ORAL_TABLET | Freq: Every day | ORAL | 4 refills | Status: AC

## 2022-03-22 NOTE — Progress Notes (Signed)
Patient ID: Melanie Andrews, female   DOB: [birth year], 26 y.o.   MRN: [id number] ? ?Chief Complaint  ?Patient presents with  ? Gynecologic Exam  ? ? ?HPI ?Melanie Andrews is a 25 y.o. female.  Complains of severe mood swings and irritability a week before and during period.  Has short 2-3 day periods, but with severe cramping.  Has a history of anxiety / depression, and was placed on Lexapro and Trazodone. ?HPI ? ?Past Medical History:  ?Diagnosis Date  ? Anxiety   ? Depression   ? ? ?Past Surgical History:  ?Procedure Laterality Date  ? TONSILLECTOMY    ? ? ?Family History  ?Problem Relation Age of Onset  ? Hypertension Mother   ? Diabetes Maternal Grandmother   ? ? ?Social History ?Social History  ? ?Tobacco Use  ? Smoking status: Never  ? Smokeless tobacco: Never  ?Vaping Use  ? Vaping Use: Never used  ?Substance Use Topics  ? Alcohol use: No  ? Drug use: Yes  ?  Frequency: 5.0 times per week  ?  Types: Marijuana  ? ? ?Allergies  ?Allergen Reactions  ? Latex Itching and Rash  ? ? ?Current Outpatient Medications  ?Medication Sig Dispense Refill  ? FLUoxetine (PROZAC) 10 MG capsule Take 1 capsule (10 mg total) by mouth daily. 30 capsule 11  ? hydrOXYzine (ATARAX/VISTARIL) 10 MG tablet Take 1 tablet (10 mg total) by mouth 3 (three) times daily as needed. 75 tablet 1  ? LO LOESTRIN FE 1 MG-10 MCG / 10 MCG tablet Take 1 tablet by mouth daily. 84 tablet 4  ? traZODone (DESYREL) 50 MG tablet Take 0.5 tablets (25 mg total) by mouth at bedtime. 15 tablet 1  ? ?No current facility-administered medications for this visit.  ? ? ?Review of Systems ?Review of Systems ?Constitutional: negative for fatigue and weight loss ?Respiratory: negative for cough and wheezing ?Cardiovascular: negative for chest pain, fatigue and palpitations ?Gastrointestinal: negative for abdominal pain and change in bowel habits ?Genitourinary: positive for dysmenorrhea and PMS ?Integument/breast: negative for nipple  discharge ?Musculoskeletal:negative for myalgias ?Neurological: negative for gait problems and tremors ?Behavioral/Psych:  positive for depression and PMS ?Endocrine: negative for temperature intolerance    ?  ?Blood pressure 113/74, pulse 81, height 5\' 2"  (1.575 m), weight 112 lb 9.6 oz (51.1 kg), last menstrual period 03/18/2022. ? ?Physical Exam ?Physical Exam ?General:   Alert and no distress  ?Skin:   no rash or abnormalities  ?Lungs:   clear to auscultation bilaterally  ?Heart:   regular rate and rhythm, S1, S2 normal, no murmur, click, rub or gallop  ?The remainder of the physical exam deferred due the type of encounter. ? ?I have spent a total of 20 minutes of face-to-face time, excluding clinical staff time, reviewing notes and preparing to see patient, ordering tests and/or medications, and counseling the patient.  ? ?Data Reviewed ?Medications ? ?Assessment  ?   ?1. PMS (premenstrual syndrome) ?Rx: ?- FLUoxetine (PROZAC) 10 MG capsule; Take 1 capsule (10 mg total) by mouth daily.  Dispense: 30 capsule; Refill: 11 ? ?2. Encounter for other general counseling and advice on contraception ?- discussed options ?- wants OCP's ? ?3. Encounter for initial prescription of contraceptive pills ?Rx: ?- LO LOESTRIN FE 1 MG-10 MCG / 10 MCG tablet; Take 1 tablet by mouth daily.  Dispense: 84 tablet; Refill: 4  ?  ? ?Plan ?  Follow up in 3 months ? ?Meds ordered this encounter  ?Medications  ?  FLUoxetine (PROZAC) 10 MG capsule  ?  Sig: Take 1 capsule (10 mg total) by mouth daily.  ?  Dispense:  30 capsule  ?  Refill:  11  ? LO LOESTRIN FE 1 MG-10 MCG / 10 MCG tablet  ?  Sig: Take 1 tablet by mouth daily.  ?  Dispense:  84 tablet  ?  Refill:  4  ?  Submit other coverage code 3  BIN:  [id number]  PCN:  [id number]   GRP:  [id number]   ID:  [id number]  ? ?  ? Brock Bad, MD ?03/23/2022 12:37 PM  ?

## 2022-03-22 NOTE — Progress Notes (Signed)
Patient presents to discuss irregular mood with her cycles that gets "extreme" right before her periods. Patient has no other concerns.  ? ?Patient currently sees a Psychiatrist for her anxiety and depression. Next appt 5/12. Elevated PHQ9 and GAD 7 score. ? ?

## 2022-03-30 ENCOUNTER — Telehealth (INDEPENDENT_AMBULATORY_CARE_PROVIDER_SITE_OTHER): Payer: No Payment, Other | Admitting: Physician Assistant

## 2022-03-30 DIAGNOSIS — F332 Major depressive disorder, recurrent severe without psychotic features: Secondary | ICD-10-CM | POA: Diagnosis not present

## 2022-03-30 DIAGNOSIS — F5105 Insomnia due to other mental disorder: Secondary | ICD-10-CM | POA: Diagnosis not present

## 2022-03-30 DIAGNOSIS — F411 Generalized anxiety disorder: Secondary | ICD-10-CM | POA: Diagnosis not present

## 2022-03-30 DIAGNOSIS — F99 Mental disorder, not otherwise specified: Secondary | ICD-10-CM

## 2022-04-02 NOTE — Progress Notes (Signed)
BH MD/PA/NP OP Progress Note ? ?Virtual Visit via Telephone Note ? ?I connected with Melanie Andrews on 03/30/22 at  2:30 PM EDT by telephone and verified that I am speaking with the correct person using two identifiers. ? ?Location: ?Patient: Home ?Provider: Clinic ?  ?I discussed the limitations, risks, security and privacy concerns of performing an evaluation and management service by telephone and the availability of in person appointments. I also discussed with the patient that there may be a patient responsible charge related to this service. The patient expressed understanding and agreed to proceed. ? ?Follow Up Instructions: ? ?I discussed the assessment and treatment plan with the patient. The patient was provided an opportunity to ask questions and all were answered. The patient agreed with the plan and demonstrated an understanding of the instructions. ?  ?The patient was advised to call back or seek an in-person evaluation if the symptoms worsen or if the condition fails to improve as anticipated. ? ?I provided 17 minutes of non-face-to-face time during this encounter. ? ?Malachy Mood, PA ? ? ?03/30/2022 5:25 PM ?Melanie Andrews  ?MRN:  [id number] ? ?Chief Complaint:  ?Chief Complaint  ?Patient presents with  ? Follow-up  ? ?HPI:  ? ?Melanie Andrews is a 26 year old female with a past psychiatric history significant for major depressive disorder, generalized anxiety disorder, and insomnia who presents to [facility] behavioral health outpatient clinic via virtual telephone visit for follow-up and medication management.  Patient was last seen by this provider on 05/04/2021.  During the last encounter patient was being managed on the following medications: ? ?Escitalopram 10 mg daily ?Hydroxyzine 10 mg 3 times daily as needed ?Trazodone 25 mg at bedtime ? ?Patient reports that her Lexapro was discontinued by her primary care provider due to being diagnosed with premenstrual dysphoric disorder.   Patient was instead placed on fluoxetine 10 mg daily for the management of her depressive symptoms, anxiety, and PPD MD.  Patient states that she has not yet started her medication and is still taking Lexapro which she supports helps with her mood.  Patient denies experiencing depressive symptoms but she still continues to endorse anxiety.  Patient rates her anxiety an 8 out of 10 and her anxiety is often accompanied by rubbing her hands and feet together.  Patient denies any new stressors at this time.  A PHQ-9 screen was performed with the patient scoring a 17.  A GAD-7 screen was also performed with the patient scoring a 16. ? ?Patient is alert and oriented x4, calm, cooperative, and fully engaged in conversation during the encounter.  Patient endorses good mood.  Patient denies suicidal or homicidal ideations.  She further denies auditory or visual hallucinations and does not appear to be responding to internal/external stimuli.  Patient endorses fair sleep and receives on average 5 to 6 hours of sleep each night.  Patient endorses decreased appetite and eats on average 1 meal per day.  Patient endorses alcohol consumption occasionally.  Patient denies tobacco use.  Patient endorses illicit drug use in the form of marijuana. ? ?Visit Diagnosis:  ?  ICD-10-CM   ?1. Severe episode of recurrent major depressive disorder, without psychotic features ([place])  F33.2   ?  ?2. GAD (generalized anxiety disorder)  F41.1   ?  ?3. Insomnia due to other mental disorder  F51.05   ? F99   ?  ? ? ?Past Psychiatric History:  ?Major depressive disorder ?Generalized anxiety disorder ?Insomnia ? ?Past Medical  History:  ?Past Medical History:  ?Diagnosis Date  ? Anxiety   ? Depression   ?  ?Past Surgical History:  ?Procedure Laterality Date  ? TONSILLECTOMY    ? ? ?Family Psychiatric History:  ?Mother - Patient states that her mother takes Prozac ?Brother - Patient states that her brother takes Prozac ?Grandmother - Patient is unsure  of her grandmother's psychiatric illness ? ?Family History:  ?Family History  ?Problem Relation Age of Onset  ? Hypertension Mother   ? Diabetes Maternal Grandmother   ? ? ?Social History:  ?Social History  ? ?Socioeconomic History  ? Marital status: Single  ?  Spouse name: Not on file  ? Number of children: Not on file  ? Years of education: Not on file  ? Highest education level: Not on file  ?Occupational History  ? Not on file  ?Tobacco Use  ? Smoking status: Never  ? Smokeless tobacco: Never  ?Vaping Use  ? Vaping Use: Never used  ?Substance and Sexual Activity  ? Alcohol use: No  ? Drug use: Yes  ?  Frequency: 5.0 times per week  ?  Types: Marijuana  ? Sexual activity: Yes  ?  Partners: Male  ?  Birth control/protection: Condom  ?Other Topics Concern  ? Not on file  ?Social History Narrative  ? Not on file  ? ?Social Determinants of Health  ? ?Financial Resource Strain: Medium Risk  ? Difficulty of Paying Living Expenses: Somewhat hard  ?Food Insecurity: No Food Insecurity  ? Worried About Charity fundraiser in the Last Year: Never true  ? Ran Out of Food in the Last Year: Never true  ?Transportation Needs: No Transportation Needs  ? Lack of Transportation (Medical): No  ? Lack of Transportation (Non-Medical): No  ?Physical Activity: Inactive  ? Days of Exercise per Week: 0 days  ? Minutes of Exercise per Session: 0 min  ?Stress: Stress Concern Present  ? Feeling of Stress : Very much  ?Social Connections: Moderately Isolated  ? Frequency of Communication with Friends and Family: More than three times a week  ? Frequency of Social Gatherings with Friends and Family: More than three times a week  ? Attends Religious Services: More than 4 times per year  ? Active Member of Clubs or Organizations: No  ? Attends Archivist Meetings: Never  ? Marital Status: Never married  ? ? ?Allergies:  ?Allergies  ?Allergen Reactions  ? Latex Itching and Rash  ? ? ?Metabolic Disorder Labs: ?No results found for:  HGBA1C, MPG ?No results found for: PROLACTIN ?No results found for: CHOL, TRIG, HDL, CHOLHDL, VLDL, LDLCALC ?No results found for: TSH ? ?Therapeutic Level Labs: ?No results found for: LITHIUM ?No results found for: VALPROATE ?No components found for:  CBMZ ? ?Current Medications: ?Current Outpatient Medications  ?Medication Sig Dispense Refill  ? FLUoxetine (PROZAC) 10 MG capsule Take 1 capsule (10 mg total) by mouth daily. 30 capsule 11  ? hydrOXYzine (ATARAX/VISTARIL) 10 MG tablet Take 1 tablet (10 mg total) by mouth 3 (three) times daily as needed. 75 tablet 1  ? LO LOESTRIN FE 1 MG-10 MCG / 10 MCG tablet Take 1 tablet by mouth daily. 84 tablet 4  ? traZODone (DESYREL) 50 MG tablet Take 0.5 tablets (25 mg total) by mouth at bedtime. 15 tablet 1  ? ?No current facility-administered medications for this visit.  ? ? ? ?Musculoskeletal: ?Strength & Muscle Tone: Unable to assess due to telemedicine visit ?Gait &  Station: Unable to assess due to telemedicine visit ?Patient leans: Unable to assess due to telemedicine visit ? ?Psychiatric Specialty Exam: ?Review of Systems  ?Psychiatric/Behavioral:  Positive for sleep disturbance. Negative for decreased concentration, dysphoric mood, hallucinations, self-injury and suicidal ideas. The patient is nervous/anxious. The patient is not hyperactive.    ?Last menstrual period 03/18/2022.There is no height or weight on file to calculate BMI.  ?General Appearance: Unable to assess due to telemedicine visit  ?Eye Contact:  Unable to assess due to telemedicine visit  ?Speech:  Clear and Coherent and Normal Rate  ?Volume:  Normal  ?Mood:  Anxious and Depressed  ?Affect:  Congruent  ?Thought Process:  Coherent, Goal Directed, and Descriptions of Associations: Intact  ?Orientation:  Full (Time, Place, and Person)  ?Thought Content: WDL   ?Suicidal Thoughts:  No  ?Homicidal Thoughts:  No  ?Memory:  Immediate;   Good ?Recent;   Good ?Remote;   Good  ?Judgement:  Good  ?Insight:  Good   ?Psychomotor Activity:  Normal  ?Concentration:  Concentration: Good and Attention Span: Good  ?Recall:  Good  ?Fund of Knowledge: Fair  ?Language: Good  ?Akathisia:  No  ?Handed:  Right  ?AIMS (if indicate

## 2022-04-03 ENCOUNTER — Encounter (HOSPITAL_COMMUNITY): Payer: Self-pay | Admitting: Physician Assistant

## 2022-05-02 ENCOUNTER — Ambulatory Visit: Payer: Medicaid Other | Admitting: Obstetrics

## 2022-05-11 ENCOUNTER — Telehealth (INDEPENDENT_AMBULATORY_CARE_PROVIDER_SITE_OTHER): Payer: No Payment, Other | Admitting: Physician Assistant

## 2022-05-11 DIAGNOSIS — F99 Mental disorder, not otherwise specified: Secondary | ICD-10-CM

## 2022-05-11 DIAGNOSIS — F5105 Insomnia due to other mental disorder: Secondary | ICD-10-CM

## 2022-05-11 DIAGNOSIS — F411 Generalized anxiety disorder: Secondary | ICD-10-CM

## 2022-05-11 DIAGNOSIS — F332 Major depressive disorder, recurrent severe without psychotic features: Secondary | ICD-10-CM

## 2022-05-21 ENCOUNTER — Encounter (HOSPITAL_COMMUNITY): Payer: Self-pay | Admitting: Physician Assistant

## 2022-05-21 NOTE — Progress Notes (Cosign Needed Addendum)
BH MD/PA/NP OP Progress Note  Virtual Visit via Video Note  I connected with Melanie Andrews on 05/11/22 at  4:00 PM EDT by a video enabled telemedicine application and verified that I am speaking with the correct person using two identifiers.  Location: Patient: Home Provider: Clinic   I discussed the limitations of evaluation and management by telemedicine and the availability of in person appointments. The patient expressed understanding and agreed to proceed.  Follow Up Instructions:  I discussed the assessment and treatment plan with the patient. The patient was provided an opportunity to ask questions and all were answered. The patient agreed with the plan and demonstrated an understanding of the instructions.   The patient was advised to call back or seek an in-person evaluation if the symptoms worsen or if the condition fails to improve as anticipated.  I provided 19 minutes of non-face-to-face time during this encounter.  Meta Hatchet, PA   05/11/2022 5:25 PM Melanie Andrews  MRN:  [id number]  Chief Complaint:  No chief complaint on file.  HPI:   Melanie Andrews is a 26 year old female with a past psychiatric history significant for major depressive disorder, generalized anxiety disorder, and insomnia who presents to [facility] via virtual video visit for follow-up and medication management.  Patient is being managed on the following medications: Fluoxetine 10 mg daily.  Patient reports that her fluoxetine has been helpful in the management of her symptoms.  Patient also has noticed an improvement in her mood when taking the medication during her periods.  Patient reports that she can tell a huge difference and the impact of her depression when taking her medication.  Patient states that when not taking her medications she is extremely emotional, fatigue, restless, less hungry, and has issues with sleep.  Patient endorses  anxiety and rates her anxiety an 8 out of 10.  Patient attributes her anxiety to her job and problems with life.  Patient denies any new stressors at this time but does endorse that she is extremely emotional due to going through a break-up.  A PHQ-9 screen was performed with the patient scoring a 16.  A GAD-7 screen was also performed with the patient scoring a 14.  Patient is alert and oriented x4, calm, cooperative, and fully engaged in conversation during the encounter.  Patient endorses okay mood but does endorse a little depression.  Patient denies suicidal or homicidal ideations.  She further denies auditory or visual hallucinations and does not appear to be responding to internal/external stimuli.  Patient endorses fair sleep and receives on average 5 to 6 hours of sleep each night.  Patient endorses decreased appetite but manages to eat at least 3 meals per day.  Patient endorses alcohol consumption sparingly.  Patient denies tobacco use.  Patient endorses illicit drug in the form of marijuana.  Visit Diagnosis:  No diagnosis found.   Past Psychiatric History:  Major depressive disorder Generalized anxiety disorder Insomnia  Past Medical History:  Past Medical History:  Diagnosis Date   Anxiety    Depression     Past Surgical History:  Procedure Laterality Date   TONSILLECTOMY      Family Psychiatric History:  Mother - Patient states that her mother takes Prozac Brother - Patient states that her brother takes Prozac Grandmother - Patient is unsure of her grandmother's psychiatric illness  Family History:  Family History  Problem Relation Age of Onset   Hypertension Mother  Diabetes Maternal Grandmother     Social History:  Social History   Socioeconomic History   Marital status: Single    Spouse name: Not on file   Number of children: Not on file   Years of education: Not on file   Highest education level: Not on file  Occupational History   Not on file   Tobacco Use   Smoking status: Never   Smokeless tobacco: Never  Vaping Use   Vaping Use: Never used  Substance and Sexual Activity   Alcohol use: No   Drug use: Yes    Frequency: 5.0 times per week    Types: Marijuana   Sexual activity: Yes    Partners: Male    Birth control/protection: Condom  Other Topics Concern   Not on file  Social History Narrative   Not on file   Social Determinants of Health   Financial Resource Strain: Medium Risk (05/01/2021)   Overall Financial Resource Strain (CARDIA)    Difficulty of Paying Living Expenses: Somewhat hard  Food Insecurity: No Food Insecurity (05/01/2021)   Hunger Vital Sign    Worried About Running Out of Food in the Last Year: Never true    Ran Out of Food in the Last Year: Never true  Transportation Needs: No Transportation Needs (05/01/2021)   PRAPARE - Administrator, Civil Service (Medical): No    Lack of Transportation (Non-Medical): No  Physical Activity: Inactive (05/01/2021)   Exercise Vital Sign    Days of Exercise per Week: 0 days    Minutes of Exercise per Session: 0 min  Stress: Stress Concern Present (05/01/2021)   [employer] of Occupational Health - Occupational Stress Questionnaire    Feeling of Stress : Very much  Social Connections: Moderately Isolated (05/01/2021)   Social Connection and Isolation Panel [NHANES]    Frequency of Communication with Friends and Family: More than three times a week    Frequency of Social Gatherings with Friends and Family: More than three times a week    Attends Religious Services: More than 4 times per year    Active Member of [employer] or Organizations: No    Attends Banker Meetings: Never    Marital Status: Never married    Allergies:  Allergies  Allergen Reactions   Latex Itching and Rash    Metabolic Disorder Labs: No results found for: "HGBA1C", "MPG" No results found for: "PROLACTIN" No results found for: "CHOL", "TRIG", "HDL",  "CHOLHDL", "VLDL", "LDLCALC" No results found for: "TSH"  Therapeutic Level Labs: No results found for: "LITHIUM" No results found for: "VALPROATE" No results found for: "CBMZ"  Current Medications: Current Outpatient Medications  Medication Sig Dispense Refill   FLUoxetine (PROZAC) 10 MG capsule Take 1 capsule (10 mg total) by mouth daily. 30 capsule 11   hydrOXYzine (ATARAX/VISTARIL) 10 MG tablet Take 1 tablet (10 mg total) by mouth 3 (three) times daily as needed. 75 tablet 1   LO LOESTRIN FE 1 MG-10 MCG / 10 MCG tablet Take 1 tablet by mouth daily. 84 tablet 4   traZODone (DESYREL) 50 MG tablet Take 0.5 tablets (25 mg total) by mouth at bedtime. 15 tablet 1   No current facility-administered medications for this visit.     Musculoskeletal: Strength & Muscle Tone: Unable to assess due to telemedicine visit Gait & Station: Unable to assess due to telemedicine visit Patient leans: Unable to assess due to telemedicine visit  Psychiatric Specialty Exam: Review of Systems  Psychiatric/Behavioral:  Positive for sleep disturbance. Negative for decreased concentration, dysphoric mood, hallucinations, self-injury and suicidal ideas. The patient is nervous/anxious. The patient is not hyperactive.     There were no vitals taken for this visit.There is no height or weight on file to calculate BMI.  General Appearance: Unable to assess due to telemedicine visit  Eye Contact:  Unable to assess due to telemedicine visit  Speech:  Clear and Coherent and Normal Rate  Volume:  Normal  Mood:  Anxious and Depressed  Affect:  Congruent  Thought Process:  Coherent, Goal Directed, and Descriptions of Associations: Intact  Orientation:  Full (Time, Place, and Person)  Thought Content: WDL   Suicidal Thoughts:  No  Homicidal Thoughts:  No  Memory:  Immediate;   Good Recent;   Good Remote;   Good  Judgement:  Good  Insight:  Good  Psychomotor Activity:  Normal  Concentration:  Concentration:  Good and Attention Span: Good  Recall:  Good  Fund of Knowledge: Fair  Language: Good  Akathisia:  No  Handed:  Right  AIMS (if indicated): not done  Assets:  Communication Skills Desire for Improvement Housing Social Support Vocational/Educational  ADL's:  Intact  Cognition: WNL  Sleep:  Fair   Screenings: GAD-7    Flowsheet Row Video Visit from 05/11/2022 in [facility] Video Visit from 03/30/2022 in [facility] Office Visit from 03/22/2022 in [facility] Office Visit from 05/04/2021 in [facility]  Total GAD-7 Score 14 16 13 20       PHQ2-9    Flowsheet Row Video Visit from 05/11/2022 in [facility] Video Visit from 03/30/2022 in [facility] Office Visit from 03/22/2022 in [facility] Office Visit from 05/04/2021 in [facility] Counselor from 05/01/2021 in [facility]  PHQ-2 Total Score 3 4 6 4 5   PHQ-9 Total Score 16 17 21 23 22       Flowsheet Row Video Visit from 05/11/2022 in [facility] Video Visit from 03/30/2022 in [facility] Office Visit from 05/04/2021 in [facility]  C-SSRS RISK CATEGORY Low Risk Low Risk Moderate Risk        Assessment and Plan:   [facility] is a 26 year old female with a past psychiatric history significant for major depressive disorder, generalized anxiety disorder, and insomnia who presents to [facility] via virtual video visit for follow-up and medication management.  Patient reports that her medication has been helpful in the management of her depressive symptoms.  She also states that her medication has been helpful in the management of her mood during her periods.   Patient endorses some anxiety but attributes her anxiety to stressors in her life.  Patient states that she also recently just went through a break-up.  Patient would like to continue taking her medication as prescribed.  Collaboration of Care: Collaboration of Care: Medication Management AEB provider managing patient's psychiatric medications, Psychiatrist AEB patient being followed by a mental health provider, and Other provider involved in patient's care AEB patient being seen by OBGYN  Patient/Guardian was advised Release of Information must be obtained prior to any record release in order to collaborate their care with an outside provider. Patient/Guardian was advised if they have not already done so to contact the registration  department to sign all necessary forms in order for [place] to release information regarding their care.   Consent: Patient/Guardian gives verbal consent for treatment and assignment of benefits for services provided during this visit. Patient/Guardian expressed understanding and agreed to proceed.   1. GAD (generalized anxiety disorder) Patient to continue taking fluoxetine 10 mg daily for the management of her generalized anxiety disorder  2. Severe episode of recurrent major depressive disorder, without psychotic features ([facility]) Patient to continue taking fluoxetine 10 mg daily for the management of her major depressive disorder  3. Insomnia due to other mental disorder  Patient to follow up in 2 months Provider spent a total of 19 minutes with the patient/reviewing patient's chart  Meta Hatchet, PA 05/11/2022, 5:25 PM

## 2022-07-20 ENCOUNTER — Telehealth (INDEPENDENT_AMBULATORY_CARE_PROVIDER_SITE_OTHER): Payer: No Payment, Other | Admitting: Physician Assistant

## 2022-07-20 DIAGNOSIS — F332 Major depressive disorder, recurrent severe without psychotic features: Secondary | ICD-10-CM | POA: Diagnosis not present

## 2022-07-20 DIAGNOSIS — F411 Generalized anxiety disorder: Secondary | ICD-10-CM

## 2022-07-20 DIAGNOSIS — G479 Sleep disorder, unspecified: Secondary | ICD-10-CM

## 2022-07-20 DIAGNOSIS — N943 Premenstrual tension syndrome: Secondary | ICD-10-CM

## 2022-07-23 ENCOUNTER — Encounter (HOSPITAL_COMMUNITY): Payer: Self-pay | Admitting: Physician Assistant

## 2022-07-23 MED ORDER — MIRTAZAPINE 7.5 MG PO TABS: 7.5000 mg | ORAL_TABLET | Freq: Every day | ORAL | 1 refills | Status: DC

## 2022-07-23 MED ORDER — FLUOXETINE HCL 20 MG PO CAPS: 20.0000 mg | ORAL_CAPSULE | Freq: Every day | ORAL | 1 refills | Status: DC

## 2022-07-23 NOTE — Progress Notes (Signed)
BH MD/PA/NP OP Progress Note  Virtual Visit via Video Note  I connected with Melanie Andrews on 07/20/22 at  4:30 PM EDT by a video enabled telemedicine application and verified that I am speaking with the correct person using two identifiers.  Location: Patient: Home Provider: Clinic   I discussed the limitations of evaluation and management by telemedicine and the availability of in person appointments. The patient expressed understanding and agreed to proceed.  Follow Up Instructions:  I discussed the assessment and treatment plan with the patient. The patient was provided an opportunity to ask questions and all were answered. The patient agreed with the plan and demonstrated an understanding of the instructions.   The patient was advised to call back or seek an in-person evaluation if the symptoms worsen or if the condition fails to improve as anticipated.  I provided 21 minutes of non-face-to-face time during this encounter.  Meta Hatchet, PA   07/20/2022 4:53 PM Melanie Andrews  MRN:  [id number]  Chief Complaint:  Chief Complaint  Patient presents with   Follow-up   Medication Management    HPI:   Melanie Andrews is a 26 year old female with a past psychiatric history significant for major depressive disorder, generalized anxiety disorder, and insomnia who presents to [facility] via virtual video visit for follow-up and medication management.  Patient is being managed on the following medications: Fluoxetine 10 mg daily.  Patient reports that they have been taking her medication as prescribed but believes that the medication needs to be adjusted.  Patient also notes that she has been having decreased appetite as well as weight loss.  Patient states that she used to weigh 122 pounds but now weighs around 100 pounds.  Patient is interested in medication options that may help promote healthy weight gain.  Patient endorses fine  mood but states that she has been experiencing depressive symptoms.  Patient endorses depressive symptoms 3 days out of the week.  Patient endorses the following depressive symptoms: feelings of sadness, crying spells, decreased appetite, and decreased sleep.  Patient endorses anxiety often accompanied by leg shaking.  Patient denies any new stressors at this time.  Patient is alert and oriented x4, calm, cooperative, and fully engaged in conversation during the encounter.  Patient endorses okay mood but does endorse a little depression.  Patient denies suicidal or homicidal ideations.  She further denies auditory or visual hallucinations and does not appear to be responding to internal/external stimuli.  Patient endorses fair sleep and receives on average 5 to 6 hours of sleep each night.  Patient endorses decreased appetite but manages to eat at least 3 meals per day.  Patient endorses alcohol consumption sparingly.  Patient denies tobacco use.  Patient endorses illicit drug in the form of marijuana.  Visit Diagnosis:    ICD-10-CM   1. GAD (generalized anxiety disorder)  F41.1 mirtazapine (REMERON) 7.5 MG tablet    FLUoxetine (PROZAC) 20 MG capsule    2. PMS (premenstrual syndrome)  N94.3 FLUoxetine (PROZAC) 20 MG capsule    3. Severe episode of recurrent major depressive disorder, without psychotic features (HCC)  F33.2 mirtazapine (REMERON) 7.5 MG tablet    FLUoxetine (PROZAC) 20 MG capsule       Past Psychiatric History:  Major depressive disorder Generalized anxiety disorder Insomnia  Past Medical History:  Past Medical History:  Diagnosis Date   Anxiety    Depression     Past Surgical History:  Procedure  Laterality Date   TONSILLECTOMY      Family Psychiatric History:  Mother - Patient states that her mother takes Prozac Brother - Patient states that her brother takes Prozac Geophysicist/field seismologist - Patient is unsure of her grandmother's psychiatric illness  Family History:  Family  History  Problem Relation Age of Onset   Hypertension Mother    Diabetes Maternal Grandmother     Social History:  Social History   Socioeconomic History   Marital status: Single    Spouse name: Not on file   Number of children: Not on file   Years of education: Not on file   Highest education level: Not on file  Occupational History   Not on file  Tobacco Use   Smoking status: Never   Smokeless tobacco: Never  Vaping Use   Vaping Use: Never used  Substance and Sexual Activity   Alcohol use: No   Drug use: Yes    Frequency: 5.0 times per week    Types: Marijuana   Sexual activity: Yes    Partners: Male    Birth control/protection: Condom  Other Topics Concern   Not on file  Social History Narrative   Not on file   Social Determinants of Health   Financial Resource Strain: Medium Risk (05/01/2021)   Overall Financial Resource Strain (CARDIA)    Difficulty of Paying Living Expenses: Somewhat hard  Food Insecurity: No Food Insecurity (05/01/2021)   Hunger Vital Sign    Worried About Running Out of Food in the Last Year: Never true    [place] in the Last Year: Never true  Transportation Needs: No Transportation Needs (05/01/2021)   PRAPARE - Hydrologist (Medical): No    Lack of Transportation (Non-Medical): No  Physical Activity: Inactive (05/01/2021)   Exercise Vital Sign    Days of Exercise per Week: 0 days    Minutes of Exercise per Session: 0 min  Stress: Stress Concern Present (05/01/2021)   [place]    Feeling of Stress : Very much  Social Connections: Moderately Isolated (05/01/2021)   Social Connection and Isolation Panel [NHANES]    Frequency of Communication with Friends and Family: More than three times a week    Frequency of Social Gatherings with Friends and Family: More than three times a week    Attends Religious Services: More than 4 times per year     Active Member of [employer] or Organizations: No    Attends Archivist Meetings: Never    Marital Status: Never married    Allergies:  Allergies  Allergen Reactions   Latex Itching and Rash    Metabolic Disorder Labs: No results found for: "HGBA1C", "MPG" No results found for: "PROLACTIN" No results found for: "CHOL", "TRIG", "HDL", "CHOLHDL", "VLDL", "LDLCALC" No results found for: "TSH"  Therapeutic Level Labs: No results found for: "LITHIUM" No results found for: "VALPROATE" No results found for: "CBMZ"  Current Medications: Current Outpatient Medications  Medication Sig Dispense Refill   mirtazapine (REMERON) 7.5 MG tablet Take 1 tablet (7.5 mg total) by mouth at bedtime. 30 tablet 1   FLUoxetine (PROZAC) 20 MG capsule Take 1 capsule (20 mg total) by mouth daily. 30 capsule 1   hydrOXYzine (ATARAX/VISTARIL) 10 MG tablet Take 1 tablet (10 mg total) by mouth 3 (three) times daily as needed. 75 tablet 1   LO LOESTRIN FE 1 MG-10 MCG / 10 MCG tablet  Take 1 tablet by mouth daily. 84 tablet 4   No current facility-administered medications for this visit.     Musculoskeletal: Strength & Muscle Tone: Unable to assess due to telemedicine visit Gait & Station: Unable to assess due to telemedicine visit Patient leans: Unable to assess due to telemedicine visit  Psychiatric Specialty Exam: Review of Systems  Psychiatric/Behavioral:  Positive for sleep disturbance. Negative for decreased concentration, dysphoric mood, hallucinations, self-injury and suicidal ideas. The patient is nervous/anxious. The patient is not hyperactive.     There were no vitals taken for this visit.There is no height or weight on file to calculate BMI.  General Appearance: Unable to assess due to telemedicine visit  Eye Contact:  Unable to assess due to telemedicine visit  Speech:  Clear and Coherent and Normal Rate  Volume:  Normal  Mood:  Anxious and Depressed  Affect:  Congruent  Thought  Process:  Coherent, Goal Directed, and Descriptions of Associations: Intact  Orientation:  Full (Time, Place, and Person)  Thought Content: WDL   Suicidal Thoughts:  No  Homicidal Thoughts:  No  Memory:  Immediate;   Good Recent;   Good Remote;   Good  Judgement:  Good  Insight:  Good  Psychomotor Activity:  Normal  Concentration:  Concentration: Good and Attention Span: Good  Recall:  Good  Fund of Knowledge: Fair  Language: Good  Akathisia:  No  Handed:  Right  AIMS (if indicated): not done  Assets:  Communication Skills Desire for Improvement Housing Social Support Vocational/Educational  ADL's:  Intact  Cognition: WNL  Sleep:  Fair   Screenings: GAD-7    Flowsheet Row Video Visit from 07/20/2022 in [facility] Video Visit from 05/11/2022 in [facility] Video Visit from 03/30/2022 in [facility] Office Visit from 03/22/2022 in [facility] Office Visit from 05/04/2021 in [facility]  Total GAD-7 Score 19 14 16 13 20       PHQ2-9    Flowsheet Row Video Visit from 07/20/2022 in [facility] Video Visit from 05/11/2022 in [facility] Video Visit from 03/30/2022 in [facility] Office Visit from 03/22/2022 in [facility] Office Visit from 05/04/2021 in [facility]  PHQ-2 Total Score 5 3 4 6 4   PHQ-9 Total Score 21 16 17 21 23       Flowsheet Row Video Visit from 07/20/2022 in [facility] Video Visit from 05/11/2022 in [facility] Video Visit from 03/30/2022 in [facility]  C-SSRS RISK CATEGORY Low Risk Low Risk Low Risk        Assessment and Plan:   [facility] is a 26 year old female with a past psychiatric history  significant for major depressive disorder, generalized anxiety disorder, and insomnia who presents to [facility] via virtual video visit for follow-up and medication management.  Patient reports that they have been taking their Prozac as prescribed but still continues to experience depressive symptoms and anxiety.  Patient also notes that they have experience weight loss and decreased appetite.  Patient was recommended increasing the Prozac from 10 mg to 20 mg daily for the management of her depressive symptoms and anxiety.  Patient was also recommended adding on mirtazapine for the medication has efficacy in managing sleep disturbances as well  as promoting weight gain.  Patient was agreeable to recommendations.  Patient's medications to be prescribed to pharmacy of choice.  Collaboration of Care: Collaboration of Care: Medication Management AEB provider managing patient's psychiatric medications, Psychiatrist AEB patient being followed by a mental health provider, and Other provider involved in patient's care AEB patient being seen by OBGYN  Patient/Guardian was advised Release of Information must be obtained prior to any record release in order to collaborate their care with an outside provider. Patient/Guardian was advised if they have not already done so to contact the registration department to sign all necessary forms in order for [place] to release information regarding their care.   Consent: Patient/Guardian gives verbal consent for treatment and assignment of benefits for services provided during this visit. Patient/Guardian expressed understanding and agreed to proceed.   1. PMS (premenstrual syndrome)  - FLUoxetine (PROZAC) 20 MG capsule; Take 1 capsule (20 mg total) by mouth daily.  Dispense: 30 capsule; Refill: 1  2. GAD (generalized anxiety disorder)  - mirtazapine (REMERON) 7.5 MG tablet; Take 1 tablet (7.5 mg total) by mouth at bedtime.  Dispense: 30  tablet; Refill: 1 - FLUoxetine (PROZAC) 20 MG capsule; Take 1 capsule (20 mg total) by mouth daily.  Dispense: 30 capsule; Refill: 1  3. Severe episode of recurrent major depressive disorder, without psychotic features ([place])  - mirtazapine (REMERON) 7.5 MG tablet; Take 1 tablet (7.5 mg total) by mouth at bedtime.  Dispense: 30 tablet; Refill: 1 - FLUoxetine (PROZAC) 20 MG capsule; Take 1 capsule (20 mg total) by mouth daily.  Dispense: 30 capsule; Refill: 1  Patient to follow up in 2 months Provider spent a total of 19 minutes with the patient/reviewing patient's chart  Malachy Mood, PA 07/20/2022, 4:53 PM

## 2022-08-17 ENCOUNTER — Encounter (HOSPITAL_COMMUNITY): Payer: Self-pay

## 2022-08-31 ENCOUNTER — Telehealth (HOSPITAL_COMMUNITY): Payer: No Payment, Other | Admitting: Physician Assistant

## 2022-10-13 ENCOUNTER — Other Ambulatory Visit (HOSPITAL_COMMUNITY): Payer: Self-pay | Admitting: Physician Assistant

## 2022-10-13 DIAGNOSIS — F411 Generalized anxiety disorder: Secondary | ICD-10-CM

## 2022-10-13 DIAGNOSIS — F332 Major depressive disorder, recurrent severe without psychotic features: Secondary | ICD-10-CM

## 2022-10-26 ENCOUNTER — Telehealth (HOSPITAL_COMMUNITY): Payer: No Payment, Other | Admitting: Psychiatry

## 2022-12-20 ENCOUNTER — Ambulatory Visit (INDEPENDENT_AMBULATORY_CARE_PROVIDER_SITE_OTHER): Payer: No Payment, Other | Admitting: Physician Assistant

## 2022-12-20 ENCOUNTER — Encounter (HOSPITAL_COMMUNITY): Payer: Self-pay | Admitting: Physician Assistant

## 2022-12-20 DIAGNOSIS — N943 Premenstrual tension syndrome: Secondary | ICD-10-CM | POA: Diagnosis not present

## 2022-12-20 DIAGNOSIS — F332 Major depressive disorder, recurrent severe without psychotic features: Secondary | ICD-10-CM | POA: Diagnosis not present

## 2022-12-20 DIAGNOSIS — F411 Generalized anxiety disorder: Secondary | ICD-10-CM

## 2022-12-20 MED ORDER — MIRTAZAPINE 7.5 MG PO TABS: 7.5000 mg | ORAL_TABLET | Freq: Every day | ORAL | 1 refills | Status: DC

## 2022-12-20 MED ORDER — FLUOXETINE HCL 40 MG PO CAPS: 40.0000 mg | ORAL_CAPSULE | Freq: Every day | ORAL | 1 refills | Status: DC

## 2022-12-20 NOTE — Progress Notes (Signed)
BH MD/PA/NP OP Progress Note  12/20/2022 8:29 PM Melanie Andrews  MRN:  [id number]  Chief Complaint:  Chief Complaint  Patient presents with   Follow-up   Medication Management   HPI:   Melanie Andrews is a 27 year old, African-American female with a past psychiatric history significant for premenstrual syndrome, generalized anxiety disorder, major depressive disorder who presents to [facility] for follow-up and medication management.  Patient was last seen by this provider on 07/20/2022.  During her last encounter, patient was being managed on the following psychiatric medications:  Mirtazapine 7.5 mg at bedtime Prozac 20 mg daily  Patient reports that she has been taking her medications regularly; however, she notes that she has been running low on her medications and has not taken her medications some days and an attempt to ration them out.  Patient reports no issues or concerns with her current medication regimen and denies experiencing any adverse side effects.  Patient continues to endorse depression attributed to being overwhelmed by stressors related to moving.  Patient notes that she has also experienced some decreased appetite since running low on her medications.  Patient also endorses anxiety once again attributed to moving.  Patient rates her anxiety as 6 out of 10 but denies any other stressors at this time.  A PHQ-9 screen was performed with the patient scoring at 17.  A GAD-7 screen was also performed with the patient scoring a 20.  The patient is alert and oriented x 4, pleasant, calm, cooperative, fully engaged in conversation during the encounter.  Patient endorses good mood.  Patient denies suicidal or homicidal ideation.  She further denies auditory or visual hallucinations and does not appear to be responding to internal/external stimuli.  Patient endorses good sleep and receives average 7 hours of sleep each night.  Patient endorses  decreased appetite most recently but states that when she was able to take her mirtazapine daily, her appetite was good.  Patient endorses alcohol consumption on occasion.  Patient denies tobacco use but does endorse engaging in illicit drug use in the form of marijuana.  Visit Diagnosis:    ICD-10-CM   1. PMS (premenstrual syndrome)  N94.3 FLUoxetine (PROZAC) 40 MG capsule    2. GAD (generalized anxiety disorder)  F41.1 FLUoxetine (PROZAC) 40 MG capsule    mirtazapine (REMERON) 7.5 MG tablet    3. Severe episode of recurrent major depressive disorder, without psychotic features (HCC)  F33.2 FLUoxetine (PROZAC) 40 MG capsule    mirtazapine (REMERON) 7.5 MG tablet      Past Psychiatric History:  Major depressive disorder Generalized anxiety disorder Insomnia  Past Medical History:  Past Medical History:  Diagnosis Date   Anxiety    Depression     Past Surgical History:  Procedure Laterality Date   TONSILLECTOMY      Family Psychiatric History:  Mother - Patient states that her mother takes Prozac Brother - Patient states that her brother takes Prozac Surveyor, minerals - Patient is unsure of her grandmother's psychiatric illness  Family History:  Family History  Problem Relation Age of Onset   Hypertension Mother    Diabetes Maternal Grandmother     Social History:  Social History   Socioeconomic History   Marital status: Single    Spouse name: Not on file   Number of children: Not on file   Years of education: Not on file   Highest education level: Not on file  Occupational History   Not on  file  Tobacco Use   Smoking status: Never   Smokeless tobacco: Never  Vaping Use   Vaping Use: Never used  Substance and Sexual Activity   Alcohol use: No   Drug use: Yes    Frequency: 5.0 times per week    Types: Marijuana   Sexual activity: Yes    Partners: Male    Birth control/protection: Condom  Other Topics Concern   Not on file  Social History Narrative   Not on  file   Social Determinants of Health   Financial Resource Strain: Medium Risk (05/01/2021)   Overall Financial Resource Strain (CARDIA)    Difficulty of Paying Living Expenses: Somewhat hard  Food Insecurity: No Food Insecurity (05/01/2021)   Hunger Vital Sign    Worried About Running Out of Food in the Last Year: Never true    Ran Out of Food in the Last Year: Never true  Transportation Needs: No Transportation Needs (05/01/2021)   PRAPARE - Hydrologist (Medical): No    Lack of Transportation (Non-Medical): No  Physical Activity: Inactive (05/01/2021)   Exercise Vital Sign    Days of Exercise per Week: 0 days    Minutes of Exercise per Session: 0 min  Stress: Stress Concern Present (05/01/2021)   [place]    Feeling of Stress : Very much  Social Connections: Moderately Isolated (05/01/2021)   Social Connection and Isolation Panel [NHANES]    Frequency of Communication with Friends and Family: More than three times a week    Frequency of Social Gatherings with Friends and Family: More than three times a week    Attends Religious Services: More than 4 times per year    Active Member of [employer] or Organizations: No    Attends Archivist Meetings: Never    Marital Status: Never married    Allergies:  Allergies  Allergen Reactions   Latex Itching and Rash    Metabolic Disorder Labs: No results found for: "HGBA1C", "MPG" No results found for: "PROLACTIN" No results found for: "CHOL", "TRIG", "HDL", "CHOLHDL", "VLDL", "LDLCALC" No results found for: "TSH"  Therapeutic Level Labs: No results found for: "LITHIUM" No results found for: "VALPROATE" No results found for: "CBMZ"  Current Medications: Current Outpatient Medications  Medication Sig Dispense Refill   FLUoxetine (PROZAC) 40 MG capsule Take 1 capsule (40 mg total) by mouth daily. 30 capsule 1   hydrOXYzine  (ATARAX/VISTARIL) 10 MG tablet Take 1 tablet (10 mg total) by mouth 3 (three) times daily as needed. 75 tablet 1   LO LOESTRIN FE 1 MG-10 MCG / 10 MCG tablet Take 1 tablet by mouth daily. 84 tablet 4   mirtazapine (REMERON) 7.5 MG tablet Take 1 tablet (7.5 mg total) by mouth at bedtime. 30 tablet 1   No current facility-administered medications for this visit.     Musculoskeletal: Strength & Muscle Tone: within normal limits Gait & Station: normal Patient leans: N/A  Psychiatric Specialty Exam: Review of Systems  Psychiatric/Behavioral:  Negative for decreased concentration, dysphoric mood, hallucinations, self-injury, sleep disturbance and suicidal ideas. The patient is nervous/anxious. The patient is not hyperactive.     There were no vitals taken for this visit.There is no height or weight on file to calculate BMI.  General Appearance: Casual  Eye Contact:  Good  Speech:  Clear and Coherent and Normal Rate  Volume:  Normal  Mood:  Anxious and Depressed  Affect:  Appropriate  Thought Process:  Coherent, Goal Directed, and Descriptions of Associations: Intact  Orientation:  Full (Time, Place, and Person)  Thought Content: WDL   Suicidal Thoughts:  No  Homicidal Thoughts:  No  Memory:  Immediate;   Good Recent;   Good Remote;   Good  Judgement:  Good  Insight:  Good  Psychomotor Activity:  Normal  Concentration:  Concentration: Good and Attention Span: Good  Recall:  Good  Fund of Knowledge: Fair  Language: Good  Akathisia:  No  Handed:  Right  AIMS (if indicated): not done  Assets:  Communication Skills Desire for Improvement Housing Social Support Vocational/Educational  ADL's:  Intact  Cognition: WNL  Sleep:  Good   Screenings: GAD-7    Flowsheet Row Clinical Support from 12/20/2022 in [facility] Video Visit from 07/20/2022 in [facility] Video Visit from 05/11/2022 in [facility] Video Visit from 03/30/2022 in [facility] Office Visit from 03/22/2022 in [facility] [place] [facility] [place]  Total GAD-7 Score 20 19 14 16 13       PHQ2-9    [place] from 12/20/2022 in [facility] Video Visit from 07/20/2022 in [facility] Video Visit from 05/11/2022 in [facility] Video Visit from 03/30/2022 in [facility] Office Visit from 03/22/2022 in [facility] [place] [facility] Total Score 3 5 3 4 6   PHQ-9 Total Score 17 21 16 17 21       Flowsheet Row Clinical Support from 12/20/2022 in [facility] Video Visit from 07/20/2022 in [facility] Video Visit from 05/11/2022 in [place]        Assessment and Plan:   [name] is a 27 year old, African-American female with a past psychiatric history significant for premenstrual syndrome, generalized anxiety disorder, major depressive disorder who presents to [facility] for follow-up and medication management.  Patient presents today requesting refills on her medications.  Patient states that since she has been running low on her medication she has had to ration them out.  Patient reports no issues or concerns regarding her current medication regimen and denies the need for dosage adjustments at this time.  Although patient continues to endorse some depression and anxiety (PHQ-9 screen of 17 and GAD-7 screen of 20), she states that her medications allow her to be stable.  Patient's medication to be e-prescribed to pharmacy of choice.  Collaboration of Care: Collaboration of Care: Medication Management AEB are managing patient's psychiatric medications,  Psychiatrist AEB patient being followed by mental health provider at this facility, and Other provider involved in patient's care AEB patient being seen by OB/GYN  Patient/Guardian was advised Release of Information must be obtained prior to any record release in order to collaborate their care with an outside provider. Patient/Guardian was advised if they have not already done so to contact the registration department to sign all necessary forms in order for [place] to release information regarding their care.   Consent: Patient/Guardian gives verbal consent for treatment and assignment of benefits for services provided during this visit. Patient/Guardian expressed understanding and agreed to proceed.   1. PMS (premenstrual syndrome)  - FLUoxetine (PROZAC) 40 MG capsule; Take 1 capsule (  40 mg total) by mouth daily.  Dispense: 30 capsule; Refill: 1  2. GAD (generalized anxiety disorder)  - FLUoxetine (PROZAC) 40 MG capsule; Take 1 capsule (40 mg total) by mouth daily.  Dispense: 30 capsule; Refill: 1 - mirtazapine (REMERON) 7.5 MG tablet; Take 1 tablet (7.5 mg total) by mouth at bedtime.  Dispense: 30 tablet; Refill: 1  3. Severe episode of recurrent major depressive disorder, without psychotic features (HCC)  - FLUoxetine (PROZAC) 40 MG capsule; Take 1 capsule (40 mg total) by mouth daily.  Dispense: 30 capsule; Refill: 1 - mirtazapine (REMERON) 7.5 MG tablet; Take 1 tablet (7.5 mg total) by mouth at bedtime.  Dispense: 30 tablet; Refill: 1  Patient to follow-up in 6 weeks Provider spent a total of 30 minutes with the patient/reviewing patient's chart  Malachy Mood, PA 12/20/2022, 8:29 PM

## 2022-12-21 ENCOUNTER — Encounter (HOSPITAL_COMMUNITY): Payer: Self-pay | Admitting: Physician Assistant

## 2022-12-22 ENCOUNTER — Other Ambulatory Visit: Payer: Self-pay

## 2022-12-22 ENCOUNTER — Encounter (HOSPITAL_BASED_OUTPATIENT_CLINIC_OR_DEPARTMENT_OTHER): Payer: Self-pay

## 2022-12-22 ENCOUNTER — Emergency Department (HOSPITAL_BASED_OUTPATIENT_CLINIC_OR_DEPARTMENT_OTHER)
Admission: EM | Admit: 2022-12-22 | Discharge: 2022-12-22 | Disposition: A | Discharge status: Home / Self Care | Payer: Medicaid Other | Attending: Emergency Medicine | Admitting: Emergency Medicine

## 2022-12-22 ENCOUNTER — Emergency Department (HOSPITAL_BASED_OUTPATIENT_CLINIC_OR_DEPARTMENT_OTHER): Payer: Medicaid Other | Admitting: Radiology

## 2022-12-22 DIAGNOSIS — R112 Nausea with vomiting, unspecified: Secondary | ICD-10-CM | POA: Diagnosis not present

## 2022-12-22 DIAGNOSIS — Z1152 Encounter for screening for COVID-19: Secondary | ICD-10-CM | POA: Diagnosis not present

## 2022-12-22 DIAGNOSIS — R1013 Epigastric pain: Secondary | ICD-10-CM | POA: Diagnosis present

## 2022-12-22 DIAGNOSIS — Z91040 Latex allergy status: Secondary | ICD-10-CM | POA: Insufficient documentation

## 2022-12-22 DIAGNOSIS — D72829 Elevated white blood cell count, unspecified: Secondary | ICD-10-CM | POA: Diagnosis not present

## 2022-12-22 LAB — CBC WITH DIFFERENTIAL/PLATELET
Abs Immature Granulocytes: 0.03 10*3/uL (ref 0.00–0.07)
Basophils Absolute: 0 10*3/uL (ref 0.0–0.1)
Basophils Relative: 0 %
Eosinophils Absolute: 0 10*3/uL (ref 0.0–0.5)
Eosinophils Relative: 0 %
HCT: 26.6 % — ABNORMAL LOW (ref 36.0–46.0)
Hemoglobin: 8.2 g/dL — ABNORMAL LOW (ref 12.0–15.0)
Immature Granulocytes: 0 %
Lymphocytes Relative: 8 %
Lymphs Abs: 0.9 10*3/uL (ref 0.7–4.0)
MCH: 22.8 pg — ABNORMAL LOW (ref 26.0–34.0)
MCHC: 30.8 g/dL (ref 30.0–36.0)
MCV: 73.9 fL — ABNORMAL LOW (ref 80.0–100.0)
Monocytes Absolute: 0.6 10*3/uL (ref 0.1–1.0)
Monocytes Relative: 5 %
Neutro Abs: 10 10*3/uL — ABNORMAL HIGH (ref 1.7–7.7)
Neutrophils Relative %: 87 %
Platelets: 438 10*3/uL — ABNORMAL HIGH (ref 150–400)
RBC: 3.6 MIL/uL — ABNORMAL LOW (ref 3.87–5.11)
RDW: 17.5 % — ABNORMAL HIGH (ref 11.5–15.5)
WBC: 11.6 10*3/uL — ABNORMAL HIGH (ref 4.0–10.5)
nRBC: 0 % (ref 0.0–0.2)

## 2022-12-22 LAB — RAPID URINE DRUG SCREEN, HOSP PERFORMED
Amphetamines: NOT DETECTED
Barbiturates: NOT DETECTED
Benzodiazepines: NOT DETECTED
Cocaine: NOT DETECTED
Opiates: NOT DETECTED
Tetrahydrocannabinol: POSITIVE — AB

## 2022-12-22 LAB — URINALYSIS, ROUTINE W REFLEX MICROSCOPIC
Bacteria, UA: NONE SEEN
Bilirubin Urine: NEGATIVE
Glucose, UA: NEGATIVE mg/dL
Hgb urine dipstick: NEGATIVE
Leukocytes,Ua: NEGATIVE
Nitrite: NEGATIVE
Specific Gravity, Urine: 1.025 (ref 1.005–1.030)
pH: 7 (ref 5.0–8.0)

## 2022-12-22 LAB — COMPREHENSIVE METABOLIC PANEL
ALT: 14 U/L (ref 0–44)
AST: 17 U/L (ref 15–41)
Albumin: 4.5 g/dL (ref 3.5–5.0)
Alkaline Phosphatase: 49 U/L (ref 38–126)
Anion gap: 12 (ref 5–15)
BUN: 13 mg/dL (ref 6–20)
CO2: 24 mmol/L (ref 22–32)
Calcium: 9.9 mg/dL (ref 8.9–10.3)
Chloride: 104 mmol/L (ref 98–111)
Creatinine, Ser: 0.7 mg/dL (ref 0.44–1.00)
GFR, Estimated: >60 mL/min (ref >60)
Glucose, Bld: 110 mg/dL — ABNORMAL HIGH (ref 70–99)
Potassium: 3.8 mmol/L (ref 3.5–5.1)
Sodium: 140 mmol/L (ref 135–145)
Total Bilirubin: 0.3 mg/dL (ref 0.3–1.2)
Total Protein: 8.2 g/dL — ABNORMAL HIGH (ref 6.5–8.1)

## 2022-12-22 LAB — PREGNANCY, URINE: Preg Test, Ur: NEGATIVE

## 2022-12-22 LAB — RESP PANEL BY RT-PCR (RSV, FLU A&B, COVID)  RVPGX2
Influenza A by PCR: NEGATIVE
Influenza B by PCR: NEGATIVE
Resp Syncytial Virus by PCR: NEGATIVE
SARS Coronavirus 2 by RT PCR: NEGATIVE

## 2022-12-22 LAB — LIPASE, BLOOD: Lipase: 11 U/L (ref 11–51)

## 2022-12-22 LAB — TROPONIN I (HIGH SENSITIVITY): Troponin I (High Sensitivity): <2 ng/L (ref <18)

## 2022-12-22 MED ORDER — ALUM & MAG HYDROXIDE-SIMETH 200-200-20 MG/5ML PO SUSP
30.0000 mL | Freq: Once | ORAL | Status: AC
  Administered 2022-12-22: 30 mL via ORAL
  Filled 2022-12-22: qty 30

## 2022-12-22 MED ORDER — SODIUM CHLORIDE 0.9 % IV BOLUS
1000.0000 mL | Freq: Once | INTRAVENOUS | Status: AC
  Administered 2022-12-22: 1000 mL via INTRAVENOUS

## 2022-12-22 MED ORDER — LIDOCAINE VISCOUS HCL 2 % MT SOLN
15.0000 mL | Freq: Once | OROMUCOSAL | Status: AC
  Administered 2022-12-22: 15 mL via ORAL
  Filled 2022-12-22: qty 15

## 2022-12-22 MED ORDER — ONDANSETRON HCL 4 MG/2ML IJ SOLN
4.0000 mg | Freq: Once | INTRAMUSCULAR | Status: AC
  Administered 2022-12-22: 4 mg via INTRAVENOUS
  Filled 2022-12-22: qty 2

## 2022-12-22 MED ORDER — SUCRALFATE 1 G PO TABS: 1.0000 g | ORAL_TABLET | Freq: Three times a day (TID) | ORAL | 0 refills | Status: AC

## 2022-12-22 MED ORDER — FAMOTIDINE IN NACL 20-0.9 MG/50ML-% IV SOLN
20.0000 mg | Freq: Once | INTRAVENOUS | Status: AC
  Administered 2022-12-22: 20 mg via INTRAVENOUS
  Filled 2022-12-22: qty 50

## 2022-12-22 MED ORDER — AZITHROMYCIN 250 MG PO TABS: 250.0000 mg | ORAL_TABLET | Freq: Every day | ORAL | 0 refills | Status: AC

## 2022-12-22 MED ORDER — ONDANSETRON HCL 4 MG PO TABS: 4.0000 mg | ORAL_TABLET | ORAL | 0 refills | Status: AC | PRN

## 2022-12-22 MED ORDER — METOCLOPRAMIDE HCL 5 MG/ML IJ SOLN
5.0000 mg | Freq: Once | INTRAMUSCULAR | Status: AC
  Administered 2022-12-22: 5 mg via INTRAVENOUS
  Filled 2022-12-22: qty 2

## 2022-12-22 MED ORDER — DIPHENHYDRAMINE HCL 50 MG/ML IJ SOLN
25.0000 mg | Freq: Once | INTRAMUSCULAR | Status: AC
  Administered 2022-12-22: 25 mg via INTRAVENOUS
  Filled 2022-12-22: qty 1

## 2022-12-22 MED ORDER — ONDANSETRON HCL 4 MG/2ML IJ SOLN
4.0000 mg | Freq: Once | INTRAMUSCULAR | Status: AC | PRN
  Administered 2022-12-22: 4 mg via INTRAVENOUS
  Filled 2022-12-22: qty 2

## 2022-12-22 NOTE — ED Notes (Signed)
She has just vomited again. Orders rec'd.

## 2022-12-22 NOTE — Discharge Instructions (Addendum)
Please refrain from [facility] use in the future  Please follow up with pcp in regards to anemia, may need to start iron supplementation in the future  It was a pleasure caring for you today in the emergency department.  Please return to the emergency department for any worsening or worrisome symptoms.

## 2022-12-22 NOTE — ED Provider Notes (Signed)
[facility] Provider Note  CSN: [id number] Arrival date & time: 12/22/22 7106  Chief Complaint(s) Chest Pain  HPI Melanie Andrews is a 27 y.o. female with past medical history as below, significant for anxiety, depression, GAD, MDD, insomnia who presents to the ED with complaint of epigastric pain.  Patient reports around 4 AM she had sensation of cramping, spasming to her epigastrium.  Began having nausea and vomiting.  Feeling hot and cold intermittently.  Pain is rating to her mid sternum, burning, cramping, spasming sensation.  No diarrhea.  No lower abdominal pain.  Dinner last night at a steak house, person she was with his asymptomatic as far she knows.  No change in urination.  No change in bowel function.  No history of prior abdominal surgery.  Medications prior to arrival, accompanied by mother  Past Medical History Past Medical History:  Diagnosis Date   Anxiety    Depression    Patient Active Problem List   Diagnosis Date Noted   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   Severe episode of recurrent major depressive disorder, without psychotic features ([place]) 05/01/2021   GAD (generalized anxiety disorder) 05/01/2021   History of abdominal pain 10/22/2016   Encounter for initial prescription of contraceptive pills 10/22/2016   Home Medication(s) Prior to Admission medications   Medication Sig Start Date End Date Taking? Authorizing Provider  azithromycin (ZITHROMAX) 250 MG tablet Take 1 tablet (250 mg total) by mouth daily. Take first 2 tablets together, then 1 every day until finished. 12/22/22  Yes Wynona Dove A, DO  ondansetron (ZOFRAN) 4 MG tablet Take 1 tablet (4 mg total) by mouth every 4 (four) hours as needed for nausea or vomiting. 12/22/22  Yes Wynona Dove A, DO  sucralfate (CARAFATE) 1 g tablet Take 1 tablet (1 g total) by mouth with breakfast, with lunch, and with evening meal for 7 days. 12/22/22 12/29/22 Yes  Jeanell Sparrow, DO  FLUoxetine (PROZAC) 40 MG capsule Take 1 capsule (40 mg total) by mouth daily. 12/20/22   Nwoko, Terese Door, PA  hydrOXYzine (ATARAX/VISTARIL) 10 MG tablet Take 1 tablet (10 mg total) by mouth 3 (three) times daily as needed. 05/05/21   Nwoko, Uchenna E, PA  LO LOESTRIN FE 1 MG-10 MCG / 10 MCG tablet Take 1 tablet by mouth daily. 03/22/22   Shelly Bombard, MD  mirtazapine (REMERON) 7.5 MG tablet Take 1 tablet (7.5 mg total) by mouth at bedtime. 12/20/22   Nwoko, Terese Door, PA  levonorgestrel-ethinyl estradiol (NORDETTE) 0.15-30 MG-MCG tablet Take 1 tablet by mouth daily. Patient not taking: Reported on 06/20/2020 04/21/20 03/28/21  Shelly Bombard, MD                                                                                                                                    Past Surgical History Past Surgical History:  Procedure  Laterality Date   TONSILLECTOMY     Family History Family History  Problem Relation Age of Onset   Hypertension Mother    Diabetes Maternal Grandmother     Social History Social History   Tobacco Use   Smoking status: Never   Smokeless tobacco: Never  Vaping Use   Vaping Use: Never used  Substance Use Topics   Alcohol use: No   Drug use: Yes    Frequency: 5.0 times per week    Types: Marijuana   Allergies Latex  Review of Systems Review of Systems  Constitutional:  Negative for activity change and fever.  HENT:  Negative for facial swelling and trouble swallowing.   Eyes:  Negative for discharge and redness.  Respiratory:  Negative for cough and shortness of breath.   Cardiovascular:  Negative for palpitations and leg swelling.  Gastrointestinal:  Positive for abdominal pain, nausea and vomiting. Negative for diarrhea.  Genitourinary:  Negative for dysuria and flank pain.  Musculoskeletal:  Negative for back pain and gait problem.  Skin:  Negative for pallor and rash.  Neurological:  Negative for syncope and headaches.     Physical Exam Vital Signs  I have reviewed the triage vital signs BP 117/67   Pulse (!) 43   Temp 97.6 F (36.4 C)   Resp 15   Ht 5\' 2"  (1.575 m)   Wt 47.6 kg   SpO2 99%   BMI 19.20 kg/m  Physical Exam Vitals and nursing note reviewed.  Constitutional:      General: She is not in acute distress.    Appearance: Normal appearance. She is well-developed. She is not ill-appearing or diaphoretic.  HENT:     Head: Normocephalic and atraumatic.     Right Ear: External ear normal.     Left Ear: External ear normal.     Nose: Nose normal.     Mouth/Throat:     Mouth: Mucous membranes are moist.  Eyes:     General: No scleral icterus.       Right eye: No discharge.        Left eye: No discharge.  Cardiovascular:     Rate and Rhythm: Normal rate and regular rhythm.     Pulses: Normal pulses.     Heart sounds: Normal heart sounds.  Pulmonary:     Effort: Pulmonary effort is normal. Tachypnea present. No respiratory distress.     Breath sounds: Normal breath sounds.  Abdominal:     General: Abdomen is flat.     Palpations: Abdomen is soft.     Tenderness: There is abdominal tenderness in the epigastric area. There is no guarding or rebound. Negative signs include Murphy's sign.    Musculoskeletal:        General: Normal range of motion.     Cervical back: Normal range of motion.     Right lower leg: No edema.     Left lower leg: No edema.  Skin:    General: Skin is warm and dry.     Capillary Refill: Capillary refill takes less than 2 seconds.  Neurological:     Mental Status: She is alert and oriented to person, place, and time.     GCS: GCS eye subscore is 4. GCS verbal subscore is 5. GCS motor subscore is 6.  Psychiatric:        Mood and Affect: Mood normal.        Behavior: Behavior normal.     ED Results and  Treatments Labs (all labs ordered are listed, but only abnormal results are displayed) Labs Reviewed  COMPREHENSIVE METABOLIC PANEL - Abnormal;  Notable for the following components:      Result Value   Glucose, Bld 110 (*)    Total Protein 8.2 (*)    All other components within normal limits  CBC WITH DIFFERENTIAL/PLATELET - Abnormal; Notable for the following components:   WBC 11.6 (*)    RBC 3.60 (*)    Hemoglobin 8.2 (*)    HCT 26.6 (*)    MCV 73.9 (*)    MCH 22.8 (*)    RDW 17.5 (*)    Platelets 438 (*)    Neutro Abs 10.0 (*)    All other components within normal limits  URINALYSIS, ROUTINE W REFLEX MICROSCOPIC - Abnormal; Notable for the following components:   APPearance CLOUDY (*)    Ketones, ur TRACE (*)    Protein, ur TRACE (*)    All other components within normal limits  RAPID URINE DRUG SCREEN, HOSP PERFORMED - Abnormal; Notable for the following components:   Tetrahydrocannabinol POSITIVE (*)    All other components within normal limits  RESP PANEL BY RT-PCR (RSV, FLU A&B, COVID)  RVPGX2  PREGNANCY, URINE  LIPASE, BLOOD  TROPONIN I (HIGH SENSITIVITY)                                                                                                                          Radiology DG Chest 2 View  Result Date: 12/22/2022 CLINICAL DATA:  Chest pain EXAM: CHEST - 2 VIEW COMPARISON:  None Available. FINDINGS: Heart size is normal. No pleural effusion or edema. Anterior left lower lobe opacity identified, best seen on the lateral projection radiograph. No signs of pleural effusion or edema. Visualized osseous structures are unremarkable. IMPRESSION: Opacity within the anterior left lower consistent with atelectasis versus pneumonia. Electronically Signed   By: Signa Kell M.D.   On: 12/22/2022 08:10    Pertinent labs & imaging results that were available during my care of the patient were reviewed by me and considered in my medical decision making (see MDM for details).  Medications Ordered in ED Medications  ondansetron (ZOFRAN) injection 4 mg (4 mg Intravenous Given 12/22/22 0731)  alum & mag hydroxide-simeth  (MAALOX/MYLANTA) 200-200-20 MG/5ML suspension 30 mL (30 mLs Oral Given 12/22/22 0808)    And  lidocaine (XYLOCAINE) 2 % viscous mouth solution 15 mL (15 mLs Oral Given 12/22/22 0808)  famotidine (PEPCID) IVPB 20 mg premix (0 mg Intravenous Stopped 12/22/22 0851)  sodium chloride 0.9 % bolus 1,000 mL (0 mLs Intravenous Stopped 12/22/22 1030)  metoCLOPramide (REGLAN) injection 5 mg (5 mg Intravenous Given 12/22/22 0849)  diphenhydrAMINE (BENADRYL) injection 25 mg (25 mg Intravenous Given 12/22/22 0849)  ondansetron (ZOFRAN) injection 4 mg (4 mg Intravenous Given 12/22/22 1102)  Procedures Procedures  (including critical care time)  Medical Decision Making / ED Course   MDM:  Miosotis Wetsel is a 27 y.o. female  with past medical history as below, significant for anxiety, depression, GAD, MDD, insomnia who presents to the ED with complaint of epigastric pain.. The complaint involves an extensive differential diagnosis and also carries with it a high risk of complications and morbidity.  Serious etiology was considered. Ddx includes but is not limited to: Differential diagnosis includes but is not exclusive to acute cholecystitis, intrathoracic causes for epigastric abdominal pain, gastritis, duodenitis, pancreatitis, small bowel or large bowel obstruction, abdominal aortic aneurysm, hernia, gastritis, etc.   On initial assessment the patient is: She is in discomfort but HDS.  Abdomen is not peritoneal. Vital signs and nursing notes were reviewed  Clinical Course as of 12/22/22 1102  Sat Dec 22, 2022  0906 Hemoglobin(!): 8.2 Pt jsut came off her menses  [SG]  1001 CXR with ?pneumonia [SG]    Clinical Course User Index [SG] Wynona Dove A, DO   CXR with possible PNA, she is afebrile but is having some epigastric pain/aching. D/w family in regards to this, they have  opted to go ahead and treat empirically for possible pna; will send abx. No respiratory distress  Abd is not peritoneal, she is resting comfortably on recheck   She has slight leukocytosis, favor reactive in setting of vomiting. Her hgb is depleted, just finished menstrual period, advised her to f/u with pcp in regards to trending her hgb, ?ida. UDS w/ thc, cmp/lipase were stable. Favor likely food borne illness vs gastritis vs thc. She is feeling much better, tolerating po, abd pain essentially resolved. RVP was neg.  She is HDS. Will send zofran/carafate and azithro to her pharmacy. Advised to f/u with pcp in 3-5 days for recheck; d/w mother and pt at bedside who are agreeable to plan  The patient improved significantly and was discharged in stable condition. Detailed discussions were had with the patient regarding current findings, and need for close f/u with PCP or on call doctor. The patient has been instructed to return immediately if the symptoms worsen in any way for re-evaluation. Patient verbalized understanding and is in agreement with current care plan. All questions answered prior to discharge.     Additional history obtained: -Additional history obtained from family -External records from outside source obtained and reviewed including: Chart review including previous notes, labs, imaging, consultation notes including urgent care notes, primary care documentation, prior labs and imaging, home medications   Lab Tests: -I ordered, reviewed, and interpreted labs.   The pertinent results include:   Labs Reviewed  COMPREHENSIVE METABOLIC PANEL - Abnormal; Notable for the following components:      Result Value   Glucose, Bld 110 (*)    Total Protein 8.2 (*)    All other components within normal limits  CBC WITH DIFFERENTIAL/PLATELET - Abnormal; Notable for the following components:   WBC 11.6 (*)    RBC 3.60 (*)    Hemoglobin 8.2 (*)    HCT 26.6 (*)    MCV 73.9 (*)    MCH 22.8  (*)    RDW 17.5 (*)    Platelets 438 (*)    Neutro Abs 10.0 (*)    All other components within normal limits  URINALYSIS, ROUTINE W REFLEX MICROSCOPIC - Abnormal; Notable for the following components:   APPearance CLOUDY (*)    Ketones, ur TRACE (*)    Protein, ur  TRACE (*)    All other components within normal limits  RAPID URINE DRUG SCREEN, HOSP PERFORMED - Abnormal; Notable for the following components:   Tetrahydrocannabinol POSITIVE (*)    All other components within normal limits  RESP PANEL BY RT-PCR (RSV, FLU A&B, COVID)  RVPGX2  PREGNANCY, URINE  LIPASE, BLOOD  TROPONIN I (HIGH SENSITIVITY)    Notable for as above  EKG   EKG Interpretation  Date/Time:  Saturday December 22 2022 07:14:11 EST Ventricular Rate:  72 PR Interval:  114 QRS Duration: 76 QT Interval:  394 QTC Calculation: 432 R Axis:   80 Text Interpretation: Sinus rhythm Borderline short PR interval similar to previous likely early repol no stemi Confirmed by Wynona Dove ([id number]) on 12/22/2022 7:19:29 AM         Imaging Studies ordered: I ordered imaging studies including cxr I independently visualized the following imaging with scope of interpretation limited to determining acute life threatening conditions related to emergency care: ?pna I independently visualized and interpreted imaging. I agree with the radiologist interpretation   Medicines ordered and prescription drug management: Meds ordered this encounter  Medications   ondansetron (ZOFRAN) injection 4 mg   AND Linked Order Group    alum & mag hydroxide-simeth (MAALOX/MYLANTA) 200-200-20 MG/5ML suspension 30 mL    lidocaine (XYLOCAINE) 2 % viscous mouth solution 15 mL   famotidine (PEPCID) IVPB 20 mg premix   sodium chloride 0.9 % bolus 1,000 mL   metoCLOPramide (REGLAN) injection 5 mg   diphenhydrAMINE (BENADRYL) injection 25 mg   ondansetron (ZOFRAN) injection 4 mg   azithromycin (ZITHROMAX) 250 MG tablet    Sig: Take 1 tablet (250  mg total) by mouth daily. Take first 2 tablets together, then 1 every day until finished.    Dispense:  6 tablet    Refill:  0   ondansetron (ZOFRAN) 4 MG tablet    Sig: Take 1 tablet (4 mg total) by mouth every 4 (four) hours as needed for nausea or vomiting.    Dispense:  6 tablet    Refill:  0   sucralfate (CARAFATE) 1 g tablet    Sig: Take 1 tablet (1 g total) by mouth with breakfast, with lunch, and with evening meal for 7 days.    Dispense:  21 tablet    Refill:  0    -I have reviewed the patients home medicines and have made adjustments as needed   Consultations Obtained: na   Cardiac Monitoring: The patient was maintained on a cardiac monitor.  I personally viewed and interpreted the cardiac monitored which showed an underlying rhythm of: nsr  Social Determinants of Health:  Diagnosis or treatment significantly limited by social determinants of health: thc use   Reevaluation: After the interventions noted above, I reevaluated the patient and found that they have improved  Co morbidities that complicate the patient evaluation  Past Medical History:  Diagnosis Date   Anxiety    Depression       Dispostion: Disposition decision including need for hospitalization was considered, and patient discharged from emergency department.    Final Clinical Impression(s) / ED Diagnoses Final diagnoses:  Nausea and vomiting, unspecified vomiting type  Epigastric pain     This chart was dictated using voice recognition software.  Despite best efforts to proofread,  errors can occur which can change the documentation meaning.    Wynona Dove A, DO 12/22/22 1102

## 2022-12-22 NOTE — ED Triage Notes (Signed)
Patient arrives to ED POV c/o Chest Pain. Per pt she woke up from the CP and states that it radiates to her stomach and that it makes her stomach tight. Pt vomiting in triage.

## 2023-02-05 ENCOUNTER — Telehealth (INDEPENDENT_AMBULATORY_CARE_PROVIDER_SITE_OTHER): Payer: Medicaid Other | Admitting: Physician Assistant

## 2023-02-05 ENCOUNTER — Encounter (HOSPITAL_COMMUNITY): Payer: Self-pay | Admitting: Physician Assistant

## 2023-02-05 DIAGNOSIS — F411 Generalized anxiety disorder: Secondary | ICD-10-CM | POA: Diagnosis not present

## 2023-02-05 DIAGNOSIS — F332 Major depressive disorder, recurrent severe without psychotic features: Secondary | ICD-10-CM | POA: Diagnosis not present

## 2023-02-05 DIAGNOSIS — N943 Premenstrual tension syndrome: Secondary | ICD-10-CM | POA: Diagnosis not present

## 2023-02-05 MED ORDER — MIRTAZAPINE 7.5 MG PO TABS: 7.5000 mg | ORAL_TABLET | Freq: Every day | ORAL | 2 refills | Status: AC

## 2023-02-05 MED ORDER — FLUOXETINE HCL 40 MG PO CAPS: 40.0000 mg | ORAL_CAPSULE | Freq: Every day | ORAL | 2 refills | Status: AC

## 2023-02-05 NOTE — Progress Notes (Signed)
BH MD/PA/NP OP Progress Note  Virtual Visit via Video Note  I connected with Melanie Andrews on 02/05/23 at  2:00 PM EDT by a video enabled telemedicine application and verified that I am speaking with the correct person using two identifiers.  Location: Patient: Home Provider: Clinic   I discussed the limitations of evaluation and management by telemedicine and the availability of in person appointments. The patient expressed understanding and agreed to proceed.  Follow Up Instructions:  I discussed the assessment and treatment plan with the patient. The patient was provided an opportunity to ask questions and all were answered. The patient agreed with the plan and demonstrated an understanding of the instructions.   The patient was advised to call back or seek an in-person evaluation if the symptoms worsen or if the condition fails to improve as anticipated.  I provided 10 minutes of non-face-to-face time during this encounter.  Malachy Mood, PA    02/05/2023 4:21 PM Dyonne Zirkelbach  MRN:  [id number]  Chief Complaint:  Chief Complaint  Patient presents with   Follow-up   Medication Refill   HPI:   Melanie Andrews is a 27 year old, African-American female with a past psychiatric history significant for premenstrual syndrome, generalized anxiety disorder, major depressive disorder who presents to [facility] via virtual video visit for follow-up and medication management.  Patient is currently being managed on the following psychiatric medications:  Mirtazapine 7.5 mg at bedtime Prozac 40 mg daily  Patient reports no issues or concerns regarding her current medication regimen.  Patient denies experiencing any adverse side effects from her medications.  Patient states that her depression has been okay but she does experience randomly feeling sad here and there.  Patient rates her depression as a 4 out of 10 with 10 being most  severe.  Patient endorses depressive episodes 2 days out of the week.  Patient endorses the following depressive symptoms: extreme emotions/fluctuating mood and decreased appetite.  Patient endorses anxiety and rates her anxiety an 8 out of 10.  Patient denies any new stressors at this time and feels stable on her current medication regimen.  A PHQ-9 screen was performed with the patient scoring a 12.  A GAD-7 screen was also performed with the patient scoring a 10.  The patient is alert and oriented x 4, pleasant, calm, cooperative, fully engaged in conversation during the encounter.  Patient endorses good mood.  Patient denies suicidal or homicidal ideations.  Patient further denies auditory or visual hallucinations and does not appear to be responding to internal/external stimuli.  Patient endorses fair sleep and receives on average 6 hours of sleep each night.  Patient endorses fair appetite and eats on average 1 meal per day.  Patient endorses alcohol consumption but states that she drinks socially.  Patient denies tobacco use.  Patient endorses illicit drug use in the form of marijuana.  Visit Diagnosis:    ICD-10-CM   1. PMS (premenstrual syndrome)  N94.3 FLUoxetine (PROZAC) 40 MG capsule    2. GAD (generalized anxiety disorder)  F41.1 FLUoxetine (PROZAC) 40 MG capsule    mirtazapine (REMERON) 7.5 MG tablet    3. Severe episode of recurrent major depressive disorder, without psychotic features (HCC)  F33.2 FLUoxetine (PROZAC) 40 MG capsule    mirtazapine (REMERON) 7.5 MG tablet      Past Psychiatric History:  Major depressive disorder Generalized anxiety disorder Insomnia  Past Medical History:  Past Medical History:  Diagnosis Date  Anxiety    Depression     Past Surgical History:  Procedure Laterality Date   TONSILLECTOMY      Family Psychiatric History:  Mother - Patient states that her mother takes Prozac Brother - Patient states that her brother takes  Prozac Geophysicist/field seismologist - Patient is unsure of her grandmother's psychiatric illness  Family History:  Family History  Problem Relation Age of Onset   Hypertension Mother    Diabetes Maternal Grandmother     Social History:  Social History   Socioeconomic History   Marital status: Single    Spouse name: Not on file   Number of children: Not on file   Years of education: Not on file   Highest education level: Not on file  Occupational History   Not on file  Tobacco Use   Smoking status: Never   Smokeless tobacco: Never  Vaping Use   Vaping Use: Never used  Substance and Sexual Activity   Alcohol use: No   Drug use: Yes    Frequency: 5.0 times per week    Types: Marijuana   Sexual activity: Yes    Partners: Male    Birth control/protection: Condom  Other Topics Concern   Not on file  Social History Narrative   Not on file   Social Determinants of Health   Financial Resource Strain: Medium Risk (05/01/2021)   Overall Financial Resource Strain (CARDIA)    Difficulty of Paying Living Expenses: Somewhat hard  Food Insecurity: No Food Insecurity (05/01/2021)   Hunger Vital Sign    Worried About Running Out of Food in the Last Year: Never true    [place] in the Last Year: Never true  Transportation Needs: No Transportation Needs (05/01/2021)   PRAPARE - Hydrologist (Medical): No    Lack of Transportation (Non-Medical): No  Physical Activity: Inactive (05/01/2021)   Exercise Vital Sign    Days of Exercise per Week: 0 days    Minutes of Exercise per Session: 0 min  Stress: Stress Concern Present (05/01/2021)   [place]    Feeling of Stress : Very much  Social Connections: Moderately Isolated (05/01/2021)   Social Connection and Isolation Panel [NHANES]    Frequency of Communication with Friends and Family: More than three times a week    Frequency of Social Gatherings with  Friends and Family: More than three times a week    Attends Religious Services: More than 4 times per year    Active Member of [employer] or Organizations: No    Attends Archivist Meetings: Never    Marital Status: Never married    Allergies:  Allergies  Allergen Reactions   Latex Itching and Rash    Metabolic Disorder Labs: No results found for: "HGBA1C", "MPG" No results found for: "PROLACTIN" No results found for: "CHOL", "TRIG", "HDL", "CHOLHDL", "VLDL", "LDLCALC" No results found for: "TSH"  Therapeutic Level Labs: No results found for: "LITHIUM" No results found for: "VALPROATE" No results found for: "CBMZ"  Current Medications: Current Outpatient Medications  Medication Sig Dispense Refill   azithromycin (ZITHROMAX) 250 MG tablet Take 1 tablet (250 mg total) by mouth daily. Take first 2 tablets together, then 1 every day until finished. 6 tablet 0   FLUoxetine (PROZAC) 40 MG capsule Take 1 capsule (40 mg total) by mouth daily. 30 capsule 2   hydrOXYzine (ATARAX/VISTARIL) 10 MG tablet Take 1 tablet (10 mg  total) by mouth 3 (three) times daily as needed. 75 tablet 1   LO LOESTRIN FE 1 MG-10 MCG / 10 MCG tablet Take 1 tablet by mouth daily. 84 tablet 4   mirtazapine (REMERON) 7.5 MG tablet Take 1 tablet (7.5 mg total) by mouth at bedtime. 30 tablet 2   ondansetron (ZOFRAN) 4 MG tablet Take 1 tablet (4 mg total) by mouth every 4 (four) hours as needed for nausea or vomiting. 6 tablet 0   sucralfate (CARAFATE) 1 g tablet Take 1 tablet (1 g total) by mouth with breakfast, with lunch, and with evening meal for 7 days. 21 tablet 0   No current facility-administered medications for this visit.     Musculoskeletal: Strength & Muscle Tone: within normal limits Gait & Station: normal Patient leans: N/A  Psychiatric Specialty Exam: Review of Systems  Psychiatric/Behavioral:  Positive for sleep disturbance. Negative for decreased concentration, dysphoric mood,  hallucinations, self-injury and suicidal ideas. The patient is nervous/anxious. The patient is not hyperactive.     There were no vitals taken for this visit.There is no height or weight on file to calculate BMI.  General Appearance: Casual  Eye Contact:  Good  Speech:  Clear and Coherent and Normal Rate  Volume:  Normal  Mood:  Anxious and Euthymic  Affect:  Appropriate  Thought Process:  Coherent, Goal Directed, and Descriptions of Associations: Intact  Orientation:  Full (Time, Place, and Person)  Thought Content: WDL   Suicidal Thoughts:  No  Homicidal Thoughts:  No  Memory:  Immediate;   Good Recent;   Good Remote;   Good  Judgement:  Good  Insight:  Good  Psychomotor Activity:  Normal  Concentration:  Concentration: Good and Attention Span: Good  Recall:  Good  Fund of Knowledge: Fair  Language: Good  Akathisia:  No  Handed:  Right  AIMS (if indicated): not done  Assets:  Communication Skills Desire for Improvement Housing Social Support Vocational/Educational  ADL's:  Intact  Cognition: WNL  Sleep:  Fair   Screenings: GAD-7    Flowsheet Row Video Visit from 02/05/2023 in [place] from 12/20/2022 in [facility] Video Visit from 07/20/2022 in [facility] Video Visit from 05/11/2022 in [facility] Video Visit from 03/30/2022 in [facility]  Total GAD-7 Score 10 20 19 14 16       PHQ2-9    Flowsheet Row Video Visit from 02/05/2023 in [place] from 12/20/2022 in [facility] Video Visit from 07/20/2022 in [facility] Video Visit from 05/11/2022 in [facility] Video Visit from 03/30/2022 in [place]  PHQ-2 Total Score 2 3 5 3 4   PHQ-9 Total Score 12 17 21  16 17       Flowsheet Row Video Visit from 02/05/2023 in [facility] ED from 12/22/2022 in [facility] at [place] from 12/20/2022 in [place] No Risk Low Risk        Assessment and Plan:   Melanie Andrews is a 27 year old, African-American female with a past psychiatric history significant for premenstrual syndrome, generalized anxiety disorder, major depressive disorder who presents to [facility] for follow-up and medication management.  Patient reports no issues or concerns  regarding her current medication regimen.  Patient continues to experience depressive symptoms and anxiety but it appears that her symptoms are manageable.  Patient denies the need for dosage adjustments at this time and is requesting refills on her medications following the conclusion of the encounter.  Patient medications to be e-prescribed to pharmacy of choice.  Collaboration of Care: Collaboration of Care: Medication Management AEB are managing patient's psychiatric medications, Psychiatrist AEB patient being followed by mental health provider at this facility, and Other provider involved in patient's care AEB patient being seen by OB/GYN  Patient/Guardian was advised Release of Information must be obtained prior to any record release in order to collaborate their care with an outside provider. Patient/Guardian was advised if they have not already done so to contact the registration department to sign all necessary forms in order for [place] to release information regarding their care.   Consent: Patient/Guardian gives verbal consent for treatment and assignment of benefits for services provided during this visit. Patient/Guardian expressed understanding and agreed to proceed.   1. PMS (premenstrual syndrome)  - FLUoxetine (PROZAC) 40 MG capsule;  Take 1 capsule (40 mg total) by mouth daily.  Dispense: 30 capsule; Refill: 2  2. GAD (generalized anxiety disorder)  - FLUoxetine (PROZAC) 40 MG capsule; Take 1 capsule (40 mg total) by mouth daily.  Dispense: 30 capsule; Refill: 2 - mirtazapine (REMERON) 7.5 MG tablet; Take 1 tablet (7.5 mg total) by mouth at bedtime.  Dispense: 30 tablet; Refill: 2  3. Severe episode of recurrent major depressive disorder, without psychotic features (HCC)  - FLUoxetine (PROZAC) 40 MG capsule; Take 1 capsule (40 mg total) by mouth daily.  Dispense: 30 capsule; Refill: 2 - mirtazapine (REMERON) 7.5 MG tablet; Take 1 tablet (7.5 mg total) by mouth at bedtime.  Dispense: 30 tablet; Refill: 2  Patient to follow-up in 2 months Provider spent a total of 10 minutes with the patient/reviewing patient's chart  Malachy Mood, PA 02/05/2023, 4:21 PM

## 2023-04-17 ENCOUNTER — Telehealth (HOSPITAL_COMMUNITY): Payer: No Payment, Other | Admitting: Physician Assistant

## 2023-08-13 ENCOUNTER — Encounter (HOSPITAL_COMMUNITY): Payer: No Payment, Other | Admitting: Physician Assistant

## 2023-08-14 ENCOUNTER — Encounter (HOSPITAL_COMMUNITY): Payer: No Payment, Other | Admitting: Physician Assistant

## 2023-09-29 ENCOUNTER — Emergency Department (HOSPITAL_BASED_OUTPATIENT_CLINIC_OR_DEPARTMENT_OTHER)
Admission: EM | Admit: 2023-09-29 | Discharge: 2023-09-29 | Disposition: A | Discharge status: Home / Self Care | Payer: Medicaid Other | Attending: Emergency Medicine | Admitting: Emergency Medicine

## 2023-09-29 ENCOUNTER — Encounter (HOSPITAL_BASED_OUTPATIENT_CLINIC_OR_DEPARTMENT_OTHER): Payer: Self-pay | Admitting: Emergency Medicine

## 2023-09-29 ENCOUNTER — Emergency Department (HOSPITAL_BASED_OUTPATIENT_CLINIC_OR_DEPARTMENT_OTHER): Payer: Medicaid Other

## 2023-09-29 DIAGNOSIS — M542 Cervicalgia: Secondary | ICD-10-CM | POA: Diagnosis not present

## 2023-09-29 DIAGNOSIS — S0083XA Contusion of other part of head, initial encounter: Secondary | ICD-10-CM | POA: Diagnosis not present

## 2023-09-29 DIAGNOSIS — S0993XA Unspecified injury of face, initial encounter: Secondary | ICD-10-CM | POA: Diagnosis present

## 2023-09-29 DIAGNOSIS — Z91040 Latex allergy status: Secondary | ICD-10-CM | POA: Diagnosis not present

## 2023-09-29 DIAGNOSIS — G44319 Acute post-traumatic headache, not intractable: Secondary | ICD-10-CM | POA: Diagnosis not present

## 2023-09-29 MED ORDER — KETOROLAC TROMETHAMINE 15 MG/ML IJ SOLN
15.0000 mg | Freq: Once | INTRAMUSCULAR | Status: DC
  Filled 2023-09-29: qty 1

## 2023-09-29 MED ORDER — KETOROLAC TROMETHAMINE 15 MG/ML IJ SOLN
15.0000 mg | Freq: Once | INTRAMUSCULAR | Status: AC
  Administered 2023-09-29: 15 mg via INTRAMUSCULAR
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
## 2023-11-28 ENCOUNTER — Telehealth (HOSPITAL_COMMUNITY): Payer: Self-pay | Admitting: Physician Assistant

## 2023-12-18 ENCOUNTER — Encounter (HOSPITAL_COMMUNITY): Payer: No Payment, Other | Admitting: Physician Assistant

## 2024-01-07 ENCOUNTER — Ambulatory Visit (INDEPENDENT_AMBULATORY_CARE_PROVIDER_SITE_OTHER): Payer: Self-pay | Admitting: Obstetrics & Gynecology

## 2024-01-07 ENCOUNTER — Other Ambulatory Visit (HOSPITAL_COMMUNITY)
Admission: RE | Admit: 2024-01-07 | Discharge: 2024-01-07 | Disposition: A | Discharge status: Home / Self Care | Payer: Medicaid Other | Source: Ambulatory Visit | Attending: Obstetrics & Gynecology | Admitting: Obstetrics & Gynecology

## 2024-01-07 ENCOUNTER — Encounter: Payer: Self-pay | Admitting: Obstetrics & Gynecology

## 2024-01-07 VITALS — BP 109/70 | HR 87 | Ht 62.0 in | Wt 133.8 lb

## 2024-01-07 DIAGNOSIS — Z124 Encounter for screening for malignant neoplasm of cervix: Secondary | ICD-10-CM

## 2024-01-07 DIAGNOSIS — Z319 Encounter for procreative management, unspecified: Secondary | ICD-10-CM

## 2024-01-07 NOTE — Progress Notes (Signed)
Pt. Presents for trying to conceive. Pt would like testing for PID. Started trying 4/24

## 2024-01-07 NOTE — Progress Notes (Signed)
    GYNECOLOGY PROGRESS NOTE  Subjective:    Patient ID: Melanie Andrews, female    DOB: 09-09-1996, 28 y.o.   MRN: [id number]  HPI  Patient is a 28 y.o. single G0 here because she would like to conceive. She has been with her current partner for 2 years. They used condoms for the first year but no contraception for the last year. She was treated for chlamydia in 2021 at a STI clinic in [place] but no recent positive test. She uses an app on her phone to predict ovulation. She has regular monthly periods that last about 5 days.  The following portions of the patient's history were reviewed and updated as appropriate: allergies, current medications, past family history, past medical history, past social history, past surgical history, and problem list.  Review of Systems Pertinent items are noted in HPI.   Objective:   Blood pressure 109/70, pulse 87, height 5\' 2"  (1.575 m), weight 133 lb 12.8 oz (60.7 kg), last menstrual period 12/14/2023. Body mass index is 24.47 kg/m. Well nourished, well hydrated Black female, no apparent distress She is ambulating and conversing normally. EG-  Vagina/cervix- normal Bimanual- normal size and shape, retroverted uterus, normal adnexal exam   Assessment:   Screening for cervical cancer Desire for pregnancy  Plan:  Pap smear with cervical testing Rec PNVs daily Rec that she start with ovulation prediction kits  I told her that probably all is normal and that she may just need more time to conceive but that with her h/o CT, a HSG would be fine. She will come back to see one of the docs that does them.

## 2024-01-13 ENCOUNTER — Encounter: Payer: Self-pay | Admitting: Obstetrics & Gynecology

## 2024-01-13 LAB — CYTOLOGY - PAP
Adequacy: ABNORMAL
Chlamydia: NEGATIVE
Comment: NEGATIVE
Comment: NORMAL
Neisseria Gonorrhea: NEGATIVE

## 2024-02-14 ENCOUNTER — Telehealth: Admitting: Obstetrics and Gynecology

## 2024-02-14 DIAGNOSIS — Z8619 Personal history of other infectious and parasitic diseases: Secondary | ICD-10-CM | POA: Diagnosis not present

## 2024-02-14 DIAGNOSIS — Z3169 Encounter for other general counseling and advice on procreation: Secondary | ICD-10-CM | POA: Diagnosis not present

## 2024-02-14 NOTE — Progress Notes (Signed)
   TELEHEALTH GYNECOLOGY VISIT ENCOUNTER NOTE  Provider location: [facility]   Patient location: Home  I connected with Sheppard Coil on 02/14/24 at  8:55 AM EDT by MyChart audiovisual encounter   History:  Melanie Andrews is a 28 y.o. G0P0000 with LMP 02/12/24 presenting for discussion of HSG   Saw Dr. Marice Potter 2/18 - has been having unprotected IC with her partner for ~ 1 year without conceiving. Has regular cycles and uses app on phone to track cycles. She has a history of intermittent abdominal pain and chlamydia, so she was worried she could have PID and/or blocked fallopian tubes.   Notes that her pain is sharp, sudden onset and happens around 1x/mo. Hasn't happened in a few months - the last time it happened when she was out walking her dog. Felt she was going to pass out, she sat down/rested and it went away after a few minutes.  No vaginal discharge or fevers. Doesn't seem to be linked to her cycle. On exam with Dr Marice Potter, no uterine/adnexal tenderness was noted.    Past Medical History:  Diagnosis Date   Anxiety    Depression    Past Surgical History:  Procedure Laterality Date   TONSILLECTOMY     The following portions of the patient's history were reviewed and updated as appropriate: allergies, current medications, past family history, past medical history, past social history, past surgical history and problem list.    Review of Systems:  Pertinent items noted in HPI and remainder of comprehensive ROS otherwise negative.  Physical Exam:   General:  Alert, oriented and cooperative.   Mental Status: Normal mood and affect perceived. Normal judgment and thought content.  Physical exam deferred due to nature of the encounter  Labs and Imaging No results found for this or any previous visit (from the past 2 weeks). No results found.    Assessment and Plan:   Subfertility - Low c/f active PID, discussed potential MSK etiology like muscle  spasm and pt agrees muscle spasm possible - Previously recommended PNV and OPKs - Due for repeat pap - Discussed procedure for HSG, timing HSG with cycle, and post procedural antibiotics (doxy 100 BID x 5d). Reviewed risks of pain/cramping and infection.  - Discussed HSG is not covered by insurance so will be contacted with OOP cost estimate  I provided 20 minutes of non-face-to-face time during this encounter.  Lennart Pall, MD [facility] [employer], [facility]

## 2024-02-14 NOTE — Patient Instructions (Signed)
 The best days to get pregnant are the 1-2 days before you ovulate and the day you ovulate.  You can predict the day of ovulation in 4 ways: Tracking with a calendar. This works best if you have extremely regular menstrual cycles. Tracking changes to cervical mucus. You are most likely to conceive when cervical mucus is clear and slippery. Tracking your body temperature. This is less helpful because your body temperature doesn't rise until AFTER you ovulated (too late to time intercourse) It is ideal to use a special basal body temperature thermometer since it can pick up subtle differences. You take your temperature first thing in the morning - before you get out of bed, use the bathroom, or eat/drink anything. You will need to chart your temperature every day to be able to pick up on a meaningful rise. Tracking with ovulation predictor kits. These are urine tests that you do at home. They pick up a hormone in your urine called "LH" which spikes right before you ovulate. You should time intercourse to the day of the [facility] spike. You want to start testing in the 5 days before you think you'll ovulate, but the kits usually come with instructions that explain when to use them.  If you are trying to get pregnant, take a prenatal vitamin every day. They work best to help your baby's brain & spinal cord growth if you're taking them BEFORE you get pregnant.

## 2024-02-18 ENCOUNTER — Telehealth: Payer: Self-pay | Admitting: Obstetrics and Gynecology

## 2024-02-19 NOTE — Telephone Encounter (Signed)
 Patient given estimate for HSG.  Instructions given around the scheduling process.  Patient will notify us on the first day of her next cycle and Donne Hazel our surgery scheduler will be notified to schedule procedure.

## 2024-04-09 ENCOUNTER — Telehealth: Payer: Self-pay

## 2024-04-09 NOTE — Telephone Encounter (Signed)
 I called patient to advise her HSG test is scheduled. I left a voicemail stating patient is scheduled @[facility]/Radiology dept on Tuesday, May 27th at 2 pm and she must arrive by 1:30 pm.

## 2024-04-10 ENCOUNTER — Ambulatory Visit: Admitting: Obstetrics and Gynecology

## 2024-04-14 ENCOUNTER — Inpatient Hospital Stay (HOSPITAL_COMMUNITY): Admission: RE | Admit: 2024-04-14 | Source: Ambulatory Visit

## 2024-04-26 NOTE — Nursing Note (Signed)
 Strangulation envelope handed to Officer S. Reed with [employer], badge# [id number], [phone].

## 2024-10-01 ENCOUNTER — Ambulatory Visit (INDEPENDENT_AMBULATORY_CARE_PROVIDER_SITE_OTHER): Admitting: Clinical

## 2024-10-01 DIAGNOSIS — F1220 Cannabis dependence, uncomplicated: Secondary | ICD-10-CM | POA: Diagnosis not present

## 2024-10-01 DIAGNOSIS — F331 Major depressive disorder, recurrent, moderate: Secondary | ICD-10-CM

## 2024-10-14 NOTE — Progress Notes (Signed)
 Comprehensive Clinical Assessment (CCA) Note  10/01/2024 Melanie Andrews [id number]  Virtual Visit via Video Note  I connected with Melanie Andrews on 10/01/2024 at  1:00 PM EST by a video enabled telemedicine application and verified that I am speaking with the correct person using two identifiers.  Location: Patient: work Provider: office   I discussed the limitations of evaluation and management by telemedicine and the availability of in person appointments. The patient expressed understanding and agreed to proceed.   Follow Up Instructions:    I discussed the assessment and treatment plan with the patient. The patient was provided an opportunity to ask questions and all were answered. The patient agreed with the plan and demonstrated an understanding of the instructions.   The patient was advised to call back or seek an in-person evaluation if the symptoms worsen or if the condition fails to improve as anticipated.  I provided 20 minutes of non-face-to-face time during this encounter.   Melanie Andrews Y Melanie Zuk, LCSW   Chief Complaint:  Chief Complaint  Patient presents with   Depression   Visit Diagnosis:   MDD, recurrent episode moderate Cannabis use disorder, moderate   CCA Biopsychosocial Intake/Chief Complaint:  client reported she is coming byher own referral. client reported she needs to get back on her medicine to balance her out.  Current Symptoms/Problems: client reported depressed mood, stress, anxiety and overly emotional, passive suicidal ideations without plan or intent, hard time falling asleep, and is on a supplement to help her gain weight not prescribed b a doctor because she lost 10 pounds  Patient Reported Schizophrenia/Schizoaffective Diagnosis in Past: No  Strengths: likes to help others  Preferences: No data recorded Abilities: writing, music, artistic   Type of Services Patient Feels are Needed: psychiatry  Initial Clinical Notes/Concerns:  client reported she is coming out of a relationship and is figuring out her living situation.   Mental Health Symptoms Depression:  Hopelessness; Change in energy/activity; Difficulty Concentrating; Fatigue; Increase/decrease in appetite; Irritability; Sleep (too much or little); Tearfulness; Weight gain/loss; Worthlessness   Duration of Depressive symptoms: Greater than two weeks   Mania:  None   Anxiety:   Difficulty concentrating; Irritability; Restlessness; Tension; Worrying   Psychosis:  None   Duration of Psychotic symptoms: No data recorded  Trauma:  None   Obsessions:  None   Compulsions:  None   Inattention:  None   Hyperactivity/Impulsivity:  None   Oppositional/Defiant Behaviors:  None   Emotional Irregularity:  None   Other Mood/Personality Symptoms:  No data recorded   Mental Status Exam Appearance and self-care  Stature:  Average   Weight:  Average weight   Clothing:  Casual   Grooming:  Normal   Cosmetic use:  Age appropriate   Posture/gait:  Normal   Motor activity:  Not Remarkable   Sensorium  Attention:  Normal   Concentration:  Normal   Orientation:  X5   Recall/memory:  Normal   Affect and Mood  Affect:  Congruent   Mood:  Depressed   Relating  Eye contact:  Normal   Facial expression:  Responsive   Attitude toward examiner:  Cooperative   Thought and Language  Speech flow: Clear and Coherent   Thought content:  Appropriate to Mood and Circumstances   Preoccupation:  None   Hallucinations:  None   Organization:  No data recorded  [employer] of Knowledge:  Fair   Intelligence:  Average   Abstraction:  Normal  Judgement:  Good   Reality Testing:  Adequate   Insight:  Good   Decision Making:  Normal   Social Functioning  Social Maturity:  Responsible   Social Judgement:  No data recorded  Stress  Stressors:  Housing; Family conflict; Work   Coping Ability:  Normal   Skill Deficits:   Communication; Interpersonal   Supports:  Family     Religion: Religion/Spirituality Are You A Religious Person?: Yes What is Your Religious Affiliation?: Christian  Leisure/Recreation: Leisure / Recreation Do You Have Hobbies?: Yes Leisure and Hobbies: art, walking, paint, music  Exercise/Diet: Exercise/Diet Do You Exercise?: No Have You Gained or Lost A Significant Amount of Weight in the Past Six Months?: Yes-Lost Number of Pounds Lost?: 10 Do You Follow a Special Diet?: No Do You Have Any Trouble Sleeping?: Yes Explanation of Sleeping Difficulties: 5-6 hrs per night...states that she will wake up frequently   CCA Employment/Education Employment/Work Situation: Employment / Work Situation Employment Situation: Employed Where is Patient Currently Employed?: [employer] Are You Satisfied With Your Job?: Yes  Education: Education Did Garment/textile Technologist From [employer]?: Yes Did Theme Park Manager?: No   CCA Family/Childhood History Family and Relationship History: Family history Marital status: Single Does patient have children?: No  Childhood History:  Childhood History By whom was/is the patient raised?: Mother Does patient have siblings?: No  Child/Adolescent Assessment:     CCA Substance Use Alcohol/Drug Use: Alcohol / Drug Use History of alcohol / drug use?: Yes Substance #1 Name of Substance 1: marijuana 1 - Frequency: weekly use 1 - Method of Aquiring: illegal 1- Route of Use: smoking                       ASAM's:  Six Dimensions of Multidimensional Assessment  Dimension 1:  Acute Intoxication and/or Withdrawal Potential:      Dimension 2:  Biomedical Conditions and Complications:      Dimension 3:  Emotional, Behavioral, or Cognitive Conditions and Complications:     Dimension 4:  Readiness to Change:     Dimension 5:  Relapse, Continued use, or Continued Problem Potential:     Dimension 6:  Recovery/Living  Environment:     ASAM Severity Score:    ASAM Recommended Level of Treatment: ASAM Recommended Level of Treatment: Level I Outpatient Treatment   Substance use Disorder (SUD) Substance Use Disorder (SUD)  Checklist Symptoms of Substance Use: Continued use despite having a persistent/recurrent physical/psychological problem caused/exacerbated by use, Evidence of tolerance  Recommendations for Services/Supports/Treatments: Recommendations for Services/Supports/Treatments Recommendations For Services/Supports/Treatments: Medication Management  DSM5 Diagnoses: Patient Active Problem List   Diagnosis Date Noted   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   Severe episode of recurrent major depressive disorder, without psychotic features (HCC) 05/01/2021   GAD (generalized anxiety disorder) 05/01/2021   History of abdominal pain 10/22/2016   Encounter for initial prescription of contraceptive pills 10/22/2016    Patient Centered Plan: Patient is on the following Treatment Plan(s):  Depression   Referrals to Alternative Service(s): Referred to Alternative Service(s):   Place:   Date:   Time:    Referred to Alternative Service(s):   Place:   Date:   Time:    Referred to Alternative Service(s):   Place:   Date:   Time:    Referred to Alternative Service(s):   Place:   Date:   Time:      Collaboration of Care: Medication Management [facility]  [facility]   Patient/Guardian was advised Release of Information must be obtained prior to any record release in order to collaborate their care with an outside provider. Patient/Guardian was advised if they have not already done so to contact the registration department to sign all necessary forms in order for us  to release information regarding their care.   Consent: Patient/Guardian gives verbal consent for treatment and assignment of benefits for services provided during this visit. Patient/Guardian expressed understanding and agreed to  proceed.   Kadin Bera Y Lukis Bunt, LCSW

## 2025-01-12 ENCOUNTER — Ambulatory Visit (HOSPITAL_COMMUNITY): Admitting: Student in an Organized Health Care Education/Training Program
